# Patient Record
Sex: Female | Born: 1960 | Race: White | Hispanic: No | Marital: Married | Smoking: Former smoker
Health system: Southern US, Community
[De-identification: ages and names within clinical notes are randomized; demographics above are authoritative.]

## PROBLEM LIST (undated history)

## (undated) DIAGNOSIS — F32A Depression, unspecified: Secondary | ICD-10-CM

## (undated) DIAGNOSIS — F4001 Agoraphobia with panic disorder: Secondary | ICD-10-CM

## (undated) DIAGNOSIS — F1320 Sedative, hypnotic or anxiolytic dependence, uncomplicated: Secondary | ICD-10-CM

## (undated) DIAGNOSIS — F329 Major depressive disorder, single episode, unspecified: Secondary | ICD-10-CM

## (undated) DIAGNOSIS — F419 Anxiety disorder, unspecified: Secondary | ICD-10-CM

## (undated) DIAGNOSIS — K219 Gastro-esophageal reflux disease without esophagitis: Secondary | ICD-10-CM

## (undated) DIAGNOSIS — F429 Obsessive-compulsive disorder, unspecified: Secondary | ICD-10-CM

## (undated) DIAGNOSIS — F319 Bipolar disorder, unspecified: Secondary | ICD-10-CM

## (undated) DIAGNOSIS — Z79899 Other long term (current) drug therapy: Secondary | ICD-10-CM

## (undated) HISTORY — DX: Major depressive disorder, single episode, unspecified: F32.9

## (undated) HISTORY — DX: Depression, unspecified: F32.A

## (undated) HISTORY — DX: Bipolar disorder, unspecified: F31.9

---

## 2004-09-20 ENCOUNTER — Emergency Department: Payer: Self-pay | Admitting: Emergency Medicine

## 2006-07-19 ENCOUNTER — Emergency Department: Payer: Self-pay | Admitting: Emergency Medicine

## 2007-11-23 ENCOUNTER — Emergency Department: Payer: Self-pay | Admitting: Emergency Medicine

## 2008-05-15 ENCOUNTER — Emergency Department: Payer: Self-pay | Admitting: Emergency Medicine

## 2011-01-14 ENCOUNTER — Emergency Department: Payer: Self-pay | Admitting: Emergency Medicine

## 2011-02-10 ENCOUNTER — Emergency Department: Payer: Self-pay | Admitting: Emergency Medicine

## 2011-02-12 ENCOUNTER — Emergency Department: Payer: Self-pay | Admitting: Emergency Medicine

## 2011-05-31 ENCOUNTER — Inpatient Hospital Stay: Payer: Self-pay | Admitting: Psychiatry

## 2011-09-25 ENCOUNTER — Emergency Department: Payer: Self-pay | Admitting: Emergency Medicine

## 2011-09-25 LAB — BASIC METABOLIC PANEL
Anion Gap: 4 — ABNORMAL LOW (ref 7–16)
BUN: 26 mg/dL — ABNORMAL HIGH (ref 7–18)
Calcium, Total: 8.9 mg/dL (ref 8.5–10.1)
Chloride: 110 mmol/L — ABNORMAL HIGH (ref 98–107)
Co2: 29 mmol/L (ref 21–32)
Creatinine: 0.81 mg/dL (ref 0.60–1.30)
EGFR (African American): >60
EGFR (Non-African Amer.): >60
Glucose: 84 mg/dL (ref 65–99)
Osmolality: 289 (ref 275–301)
Potassium: 4.2 mmol/L (ref 3.5–5.1)
Sodium: 143 mmol/L (ref 136–145)

## 2011-09-25 LAB — CBC
HGB: 13.8 g/dL (ref 12.0–16.0)
MCH: 32.3 pg (ref 26.0–34.0)
MCHC: 33.3 g/dL (ref 32.0–36.0)
MCV: 97 fL (ref 80–100)
Platelet: 207 10*3/uL (ref 150–440)
RBC: 4.27 10*6/uL (ref 3.80–5.20)

## 2011-12-31 ENCOUNTER — Inpatient Hospital Stay: Payer: Self-pay | Admitting: Psychiatry

## 2012-01-06 LAB — LIPID PANEL
Ldl Cholesterol, Calc: 184 mg/dL — ABNORMAL HIGH (ref 0–100)
Triglycerides: 91 mg/dL (ref 0–200)
VLDL Cholesterol, Calc: 18 mg/dL (ref 5–40)

## 2012-08-26 ENCOUNTER — Emergency Department: Payer: Self-pay | Admitting: Emergency Medicine

## 2013-01-11 ENCOUNTER — Emergency Department: Payer: Self-pay | Admitting: Emergency Medicine

## 2013-01-22 ENCOUNTER — Emergency Department: Payer: Self-pay | Admitting: Emergency Medicine

## 2013-01-22 LAB — COMPREHENSIVE METABOLIC PANEL
Alkaline Phosphatase: 112 U/L (ref 50–136)
BUN: 16 mg/dL (ref 7–18)
Bilirubin,Total: 0.4 mg/dL (ref 0.2–1.0)
Chloride: 103 mmol/L (ref 98–107)
Co2: 27 mmol/L (ref 21–32)
EGFR (African American): >60
EGFR (Non-African Amer.): 60 — ABNORMAL LOW
Glucose: 102 mg/dL — ABNORMAL HIGH (ref 65–99)
Osmolality: 273 (ref 275–301)
Potassium: 4.2 mmol/L (ref 3.5–5.1)
SGOT(AST): 16 U/L (ref 15–37)
Total Protein: 8.2 g/dL (ref 6.4–8.2)

## 2013-01-22 LAB — DRUG SCREEN, URINE
Barbiturates, Ur Screen: NEGATIVE (ref ?–200)
Benzodiazepine, Ur Scrn: NEGATIVE (ref ?–200)
Cocaine Metabolite,Ur ~~LOC~~: NEGATIVE (ref ?–300)
MDMA (Ecstasy)Ur Screen: NEGATIVE (ref ?–500)
Methadone, Ur Screen: NEGATIVE (ref ?–300)
Opiate, Ur Screen: NEGATIVE (ref ?–300)
Phencyclidine (PCP) Ur S: NEGATIVE (ref ?–25)

## 2013-01-22 LAB — TSH: Thyroid Stimulating Horm: 0.69 u[IU]/mL

## 2013-01-22 LAB — CBC
HGB: 15.5 g/dL (ref 12.0–16.0)
MCH: 31.6 pg (ref 26.0–34.0)
Platelet: 273 10*3/uL (ref 150–440)
RBC: 4.9 10*6/uL (ref 3.80–5.20)
WBC: 6.6 10*3/uL (ref 3.6–11.0)

## 2013-01-22 LAB — SALICYLATE LEVEL: Salicylates, Serum: 5.1 mg/dL — ABNORMAL HIGH

## 2013-01-22 LAB — ETHANOL: Ethanol: <3 mg/dL

## 2013-01-23 ENCOUNTER — Inpatient Hospital Stay: Payer: Self-pay | Admitting: Psychiatry

## 2013-01-23 LAB — URINALYSIS, COMPLETE
Bacteria: NONE SEEN
Bilirubin,UR: NEGATIVE
Glucose,UR: NEGATIVE mg/dL (ref 0–75)
Leukocyte Esterase: NEGATIVE
Ph: 6 (ref 4.5–8.0)
WBC UR: 2 /HPF (ref 0–5)

## 2013-01-24 LAB — BEHAVIORAL MEDICINE 1 PANEL
Albumin: 4.3 g/dL (ref 3.4–5.0)
BUN: 18 mg/dL (ref 7–18)
Basophil %: 1.3 %
Co2: 24 mmol/L (ref 21–32)
Creatinine: 1.25 mg/dL (ref 0.60–1.30)
Eosinophil %: 0 %
HGB: 16.5 g/dL — ABNORMAL HIGH (ref 12.0–16.0)
MCHC: 34.7 g/dL (ref 32.0–36.0)
MCV: 93 fL (ref 80–100)
Monocyte #: 0.6 x10 3/mm (ref 0.2–0.9)
Monocyte %: 10 %
Neutrophil #: 3.4 10*3/uL (ref 1.4–6.5)
Platelet: 270 10*3/uL (ref 150–440)
Potassium: 3.9 mmol/L (ref 3.5–5.1)
RBC: 5.13 10*6/uL (ref 3.80–5.20)
RDW: 14.7 % — ABNORMAL HIGH (ref 11.5–14.5)
SGPT (ALT): 14 U/L (ref 12–78)
Sodium: 134 mmol/L — ABNORMAL LOW (ref 136–145)
Total Protein: 8.1 g/dL (ref 6.4–8.2)
WBC: 5.7 10*3/uL (ref 3.6–11.0)

## 2013-01-24 LAB — LIPID PANEL
HDL Cholesterol: 42 mg/dL (ref 40–60)
Ldl Cholesterol, Calc: 267 mg/dL — ABNORMAL HIGH (ref 0–100)
Triglycerides: 140 mg/dL (ref 0–200)
VLDL Cholesterol, Calc: 28 mg/dL (ref 5–40)

## 2013-02-18 ENCOUNTER — Ambulatory Visit: Payer: Self-pay | Admitting: Psychiatry

## 2013-02-19 ENCOUNTER — Emergency Department: Payer: Self-pay | Admitting: Emergency Medicine

## 2013-02-19 LAB — URINALYSIS, COMPLETE
Ketone: NEGATIVE
Ph: 6 (ref 4.5–8.0)
RBC,UR: 309 /HPF (ref 0–5)
Specific Gravity: 1.019 (ref 1.003–1.030)
Squamous Epithelial: 3
WBC UR: 4 /HPF (ref 0–5)

## 2013-02-19 LAB — COMPREHENSIVE METABOLIC PANEL
Albumin: 3.8 g/dL (ref 3.4–5.0)
Anion Gap: 10 (ref 7–16)
Bilirubin,Total: 0.4 mg/dL (ref 0.2–1.0)
Calcium, Total: 9.3 mg/dL (ref 8.5–10.1)
EGFR (African American): >60
EGFR (Non-African Amer.): 57 — ABNORMAL LOW
Glucose: 152 mg/dL — ABNORMAL HIGH (ref 65–99)
Osmolality: 288 (ref 275–301)
Potassium: 3.8 mmol/L (ref 3.5–5.1)
SGOT(AST): 26 U/L (ref 15–37)

## 2013-02-19 LAB — CBC
HCT: 39 % (ref 35.0–47.0)
HGB: 13.4 g/dL (ref 12.0–16.0)
MCH: 31.1 pg (ref 26.0–34.0)
MCHC: 34.5 g/dL (ref 32.0–36.0)
Platelet: 201 10*3/uL (ref 150–440)
RBC: 4.31 10*6/uL (ref 3.80–5.20)
RDW: 13.5 % (ref 11.5–14.5)

## 2013-03-15 ENCOUNTER — Ambulatory Visit: Payer: Self-pay | Admitting: Psychiatry

## 2013-03-31 ENCOUNTER — Emergency Department: Payer: Self-pay | Admitting: Unknown Physician Specialty

## 2013-03-31 LAB — CBC
HGB: 11.9 g/dL — ABNORMAL LOW (ref 12.0–16.0)
MCH: 32 pg (ref 26.0–34.0)
Platelet: 245 10*3/uL (ref 150–440)
RDW: 17.1 % — ABNORMAL HIGH (ref 11.5–14.5)
WBC: 4.3 10*3/uL (ref 3.6–11.0)

## 2013-03-31 LAB — COMPREHENSIVE METABOLIC PANEL
BUN: 20 mg/dL — ABNORMAL HIGH (ref 7–18)
Bilirubin,Total: 0.4 mg/dL (ref 0.2–1.0)
Chloride: 115 mmol/L — ABNORMAL HIGH (ref 98–107)
Co2: 22 mmol/L (ref 21–32)
Creatinine: 0.78 mg/dL (ref 0.60–1.30)
EGFR (African American): >60
EGFR (Non-African Amer.): >60
Osmolality: 285 (ref 275–301)
Potassium: 3.4 mmol/L — ABNORMAL LOW (ref 3.5–5.1)
SGPT (ALT): 13 U/L (ref 12–78)

## 2013-03-31 LAB — CK TOTAL AND CKMB (NOT AT ARMC): CK-MB: 5.5 ng/mL — ABNORMAL HIGH (ref 0.5–3.6)

## 2013-03-31 LAB — TROPONIN I: Troponin-I: <0.02 ng/mL

## 2013-04-13 ENCOUNTER — Ambulatory Visit: Payer: Self-pay | Admitting: Psychiatry

## 2013-04-30 LAB — CBC
HCT: 31.9 % — ABNORMAL LOW (ref 35.0–47.0)
MCH: 33.3 pg (ref 26.0–34.0)
MCHC: 33.8 g/dL (ref 32.0–36.0)
RBC: 3.23 10*6/uL — ABNORMAL LOW (ref 3.80–5.20)
WBC: 10.1 10*3/uL (ref 3.6–11.0)

## 2013-04-30 LAB — URINALYSIS, COMPLETE
Blood: NEGATIVE
Glucose,UR: NEGATIVE mg/dL (ref 0–75)
Hyaline Cast: 3
Ph: 5 (ref 4.5–8.0)
RBC,UR: <1 /HPF (ref 0–5)
Specific Gravity: 1.024 (ref 1.003–1.030)
Squamous Epithelial: NONE SEEN

## 2013-04-30 LAB — COMPREHENSIVE METABOLIC PANEL
Albumin: 2.8 g/dL — ABNORMAL LOW (ref 3.4–5.0)
Alkaline Phosphatase: 68 U/L (ref 50–136)
Anion Gap: 6 — ABNORMAL LOW (ref 7–16)
BUN: 19 mg/dL — ABNORMAL HIGH (ref 7–18)
Bilirubin,Total: 0.6 mg/dL (ref 0.2–1.0)
Calcium, Total: 6.3 mg/dL — CL (ref 8.5–10.1)
Chloride: 126 mmol/L — ABNORMAL HIGH (ref 98–107)
Co2: 12 mmol/L — ABNORMAL LOW (ref 21–32)
Creatinine: 0.64 mg/dL (ref 0.60–1.30)
EGFR (African American): >60
EGFR (Non-African Amer.): >60
Glucose: 117 mg/dL — ABNORMAL HIGH (ref 65–99)
Osmolality: 290 (ref 275–301)
Potassium: 3.3 mmol/L — ABNORMAL LOW (ref 3.5–5.1)
Sodium: 144 mmol/L (ref 136–145)
Total Protein: 5.5 g/dL — ABNORMAL LOW (ref 6.4–8.2)

## 2013-04-30 LAB — SALICYLATE LEVEL: Salicylates, Serum: 63.7 mg/dL

## 2013-04-30 LAB — TSH: Thyroid Stimulating Horm: 0.419 u[IU]/mL — ABNORMAL LOW

## 2013-04-30 LAB — ETHANOL: Ethanol %: <0.003 % (ref 0.000–0.080)

## 2013-05-01 ENCOUNTER — Inpatient Hospital Stay: Payer: Self-pay | Admitting: Internal Medicine

## 2013-05-01 LAB — BASIC METABOLIC PANEL WITH GFR
Anion Gap: 1 — ABNORMAL LOW
Anion Gap: 1 — ABNORMAL LOW
BUN: 25 mg/dL — ABNORMAL HIGH
BUN: 22 mg/dL — ABNORMAL HIGH
Calcium, Total: 7.4 mg/dL — ABNORMAL LOW
Calcium, Total: 7.3 mg/dL — ABNORMAL LOW
Chloride: 109 mmol/L — ABNORMAL HIGH
Chloride: 107 mmol/L
Co2: 30 mmol/L
Co2: 33 mmol/L — ABNORMAL HIGH
Creatinine: 0.77 mg/dL
Creatinine: 0.88 mg/dL
EGFR (African American): >60
EGFR (African American): >60
EGFR (Non-African Amer.): >60
EGFR (Non-African Amer.): >60
Glucose: 173 mg/dL — ABNORMAL HIGH
Glucose: 142 mg/dL — ABNORMAL HIGH
Osmolality: 288
Osmolality: 287
Potassium: 3.1 mmol/L — ABNORMAL LOW
Potassium: 3.5 mmol/L
Sodium: 140 mmol/L
Sodium: 141 mmol/L

## 2013-05-01 LAB — DRUG SCREEN, URINE
Barbiturates, Ur Screen: NEGATIVE (ref ?–200)
Opiate, Ur Screen: NEGATIVE (ref ?–300)
Phencyclidine (PCP) Ur S: NEGATIVE (ref ?–25)
Tricyclic, Ur Screen: NEGATIVE (ref ?–1000)

## 2013-05-01 LAB — CBC WITH DIFFERENTIAL/PLATELET
Basophil #: 0 10*3/uL (ref 0.0–0.1)
Eosinophil %: 0.1 %
HCT: 31.7 % — ABNORMAL LOW (ref 35.0–47.0)
HGB: 11 g/dL — ABNORMAL LOW (ref 12.0–16.0)
MCHC: 34.9 g/dL (ref 32.0–36.0)
MCV: 96 fL (ref 80–100)
Monocyte %: 3.2 %
Neutrophil #: 8 10*3/uL — ABNORMAL HIGH (ref 1.4–6.5)
RBC: 3.31 10*6/uL — ABNORMAL LOW (ref 3.80–5.20)
RDW: 16.7 % — ABNORMAL HIGH (ref 11.5–14.5)

## 2013-05-01 LAB — BASIC METABOLIC PANEL
Anion Gap: 1 — ABNORMAL LOW (ref 7–16)
Anion Gap: 2 — ABNORMAL LOW (ref 7–16)
BUN: 27 mg/dL — ABNORMAL HIGH (ref 7–18)
BUN: 27 mg/dL — ABNORMAL HIGH (ref 7–18)
Calcium, Total: 8.1 mg/dL — ABNORMAL LOW (ref 8.5–10.1)
Calcium, Total: 7.6 mg/dL — ABNORMAL LOW (ref 8.5–10.1)
Co2: 27 mmol/L (ref 21–32)
Creatinine: 0.84 mg/dL (ref 0.60–1.30)
Creatinine: 0.84 mg/dL (ref 0.60–1.30)
EGFR (African American): >60
EGFR (African American): >60
EGFR (Non-African Amer.): >60
Glucose: 192 mg/dL — ABNORMAL HIGH (ref 65–99)
Osmolality: 294 (ref 275–301)
Osmolality: 292 (ref 275–301)
Potassium: 3.8 mmol/L (ref 3.5–5.1)
Potassium: 3.4 mmol/L — ABNORMAL LOW (ref 3.5–5.1)
Sodium: 141 mmol/L (ref 136–145)

## 2013-05-01 LAB — SALICYLATE LEVEL
Salicylates, Serum: 66.5 mg/dL
Salicylates, Serum: 47.8 mg/dL
Salicylates, Serum: 41.2 mg/dL
Salicylates, Serum: 31.1 mg/dL

## 2013-05-01 LAB — URINE PH
Ph: 5 (ref 4.5–8.0)
Ph: 5

## 2013-05-02 LAB — CBC WITH DIFFERENTIAL/PLATELET
Basophil #: 0 10*3/uL (ref 0.0–0.1)
Eosinophil #: 0 10*3/uL (ref 0.0–0.7)
Eosinophil %: 0.1 %
HGB: 9.9 g/dL — ABNORMAL LOW (ref 12.0–16.0)
Lymphocyte #: 1.3 10*3/uL (ref 1.0–3.6)
Lymphocyte %: 13.8 %
MCH: 33.3 pg (ref 26.0–34.0)
MCHC: 34.6 g/dL (ref 32.0–36.0)
Monocyte #: 0.9 x10 3/mm (ref 0.2–0.9)
Neutrophil %: 76.4 %
RBC: 2.98 10*6/uL — ABNORMAL LOW (ref 3.80–5.20)
WBC: 9.6 10*3/uL (ref 3.6–11.0)

## 2013-05-02 LAB — BASIC METABOLIC PANEL
Anion Gap: 2 — ABNORMAL LOW (ref 7–16)
Anion Gap: 4 — ABNORMAL LOW (ref 7–16)
BUN: 18 mg/dL (ref 7–18)
Calcium, Total: 7.7 mg/dL — ABNORMAL LOW (ref 8.5–10.1)
Chloride: 102 mmol/L (ref 98–107)
Co2: 37 mmol/L — ABNORMAL HIGH (ref 21–32)
Creatinine: 0.72 mg/dL (ref 0.60–1.30)
EGFR (African American): >60
EGFR (Non-African Amer.): >60
Glucose: 132 mg/dL — ABNORMAL HIGH (ref 65–99)
Osmolality: 287 (ref 275–301)
Potassium: 3.1 mmol/L — ABNORMAL LOW (ref 3.5–5.1)
Sodium: 142 mmol/L (ref 136–145)

## 2013-05-02 LAB — SALICYLATE LEVEL: Salicylates, Serum: 11.6 mg/dL — ABNORMAL HIGH

## 2013-05-02 LAB — MAGNESIUM: Magnesium: 2.4 mg/dL

## 2013-05-03 LAB — BASIC METABOLIC PANEL
BUN: 10 mg/dL (ref 7–18)
Chloride: 107 mmol/L (ref 98–107)
EGFR (African American): >60
Sodium: 141 mmol/L (ref 136–145)

## 2013-05-03 LAB — CBC WITH DIFFERENTIAL/PLATELET
Basophil %: 0.4 %
Eosinophil %: 0.2 %
Lymphocyte #: 0.7 10*3/uL — ABNORMAL LOW (ref 1.0–3.6)
Lymphocyte %: 11.6 %
MCH: 33 pg (ref 26.0–34.0)
MCHC: 34 g/dL (ref 32.0–36.0)
MCV: 97 fL (ref 80–100)
Monocyte #: 0.4 x10 3/mm (ref 0.2–0.9)
Monocyte %: 5.8 %
Platelet: 114 10*3/uL — ABNORMAL LOW (ref 150–440)
RBC: 3.01 10*6/uL — ABNORMAL LOW (ref 3.80–5.20)
WBC: 6.2 10*3/uL (ref 3.6–11.0)

## 2013-05-03 LAB — MAGNESIUM: Magnesium: 1.9 mg/dL

## 2013-05-04 LAB — BASIC METABOLIC PANEL
Anion Gap: 4 — ABNORMAL LOW (ref 7–16)
BUN: 16 mg/dL (ref 7–18)
Calcium, Total: 9 mg/dL (ref 8.5–10.1)
Chloride: 113 mmol/L — ABNORMAL HIGH (ref 98–107)
Creatinine: 0.54 mg/dL — ABNORMAL LOW (ref 0.60–1.30)
EGFR (African American): >60
Glucose: 129 mg/dL — ABNORMAL HIGH (ref 65–99)
Osmolality: 286 (ref 275–301)
Potassium: 4.2 mmol/L (ref 3.5–5.1)
Sodium: 142 mmol/L (ref 136–145)

## 2013-05-04 LAB — CBC WITH DIFFERENTIAL/PLATELET
Basophil #: 0 10*3/uL (ref 0.0–0.1)
Basophil %: 0.3 %
Eosinophil #: 0 10*3/uL (ref 0.0–0.7)
HCT: 29.9 % — ABNORMAL LOW (ref 35.0–47.0)
HGB: 10.3 g/dL — ABNORMAL LOW (ref 12.0–16.0)
Lymphocyte %: 5.6 %
MCH: 33.2 pg (ref 26.0–34.0)
MCHC: 34.5 g/dL (ref 32.0–36.0)
Monocyte #: 0.3 x10 3/mm (ref 0.2–0.9)
Neutrophil %: 88 %
RDW: 15.9 % — ABNORMAL HIGH (ref 11.5–14.5)

## 2013-05-04 LAB — PHOSPHORUS: Phosphorus: 2.8 mg/dL (ref 2.5–4.9)

## 2013-05-04 LAB — EXPECTORATED SPUTUM ASSESSMENT W GRAM STAIN, RFLX TO RESP C

## 2013-05-04 LAB — MAGNESIUM: Magnesium: 2.1 mg/dL

## 2013-05-05 ENCOUNTER — Ambulatory Visit: Payer: Self-pay | Admitting: Neurology

## 2013-05-05 LAB — BASIC METABOLIC PANEL
Anion Gap: 6 — ABNORMAL LOW (ref 7–16)
Chloride: 110 mmol/L — ABNORMAL HIGH (ref 98–107)
Co2: 24 mmol/L (ref 21–32)
Creatinine: 0.4 mg/dL — ABNORMAL LOW (ref 0.60–1.30)
EGFR (African American): >60
EGFR (Non-African Amer.): >60
Glucose: 110 mg/dL — ABNORMAL HIGH (ref 65–99)
Osmolality: 281 (ref 275–301)
Potassium: 4.2 mmol/L (ref 3.5–5.1)
Sodium: 140 mmol/L (ref 136–145)

## 2013-05-05 LAB — CBC WITH DIFFERENTIAL/PLATELET
Basophil #: 0 10*3/uL (ref 0.0–0.1)
Eosinophil #: 0 10*3/uL (ref 0.0–0.7)
HGB: 10.9 g/dL — ABNORMAL LOW (ref 12.0–16.0)
Lymphocyte #: 0.6 10*3/uL — ABNORMAL LOW (ref 1.0–3.6)
Lymphocyte %: 8.4 %
MCV: 96 fL (ref 80–100)
Monocyte %: 7.6 %
Neutrophil #: 6 10*3/uL (ref 1.4–6.5)
Neutrophil %: 83.5 %
RBC: 3.32 10*6/uL — ABNORMAL LOW (ref 3.80–5.20)
RDW: 15.9 % — ABNORMAL HIGH (ref 11.5–14.5)
WBC: 7.2 10*3/uL (ref 3.6–11.0)

## 2013-05-06 LAB — CBC WITH DIFFERENTIAL/PLATELET
Basophil #: 0 10*3/uL (ref 0.0–0.1)
Basophil %: 0.5 %
Eosinophil %: 0 %
HGB: 12.9 g/dL (ref 12.0–16.0)
Neutrophil %: 81.5 %
RBC: 3.92 10*6/uL (ref 3.80–5.20)
RDW: 15.8 % — ABNORMAL HIGH (ref 11.5–14.5)
WBC: 7.1 10*3/uL (ref 3.6–11.0)

## 2013-05-06 LAB — MAGNESIUM: Magnesium: 1.9 mg/dL

## 2013-05-06 LAB — BASIC METABOLIC PANEL
Anion Gap: 6 — ABNORMAL LOW (ref 7–16)
BUN: 24 mg/dL — ABNORMAL HIGH (ref 7–18)
Co2: 28 mmol/L (ref 21–32)
EGFR (African American): >60
EGFR (Non-African Amer.): >60
Osmolality: 273 (ref 275–301)
Sodium: 134 mmol/L — ABNORMAL LOW (ref 136–145)

## 2013-05-07 LAB — BASIC METABOLIC PANEL
BUN: 32 mg/dL — ABNORMAL HIGH (ref 7–18)
Calcium, Total: 10.2 mg/dL — ABNORMAL HIGH (ref 8.5–10.1)
Creatinine: 0.57 mg/dL — ABNORMAL LOW (ref 0.60–1.30)
EGFR (African American): >60
EGFR (Non-African Amer.): >60
Sodium: 130 mmol/L — ABNORMAL LOW (ref 136–145)

## 2013-05-07 LAB — PHOSPHORUS: Phosphorus: 4 mg/dL (ref 2.5–4.9)

## 2013-05-08 LAB — BASIC METABOLIC PANEL
Anion Gap: 7 (ref 7–16)
BUN: 24 mg/dL — ABNORMAL HIGH (ref 7–18)
Calcium, Total: 10.1 mg/dL (ref 8.5–10.1)
Chloride: 90 mmol/L — ABNORMAL LOW (ref 98–107)
Co2: 29 mmol/L (ref 21–32)
Creatinine: 0.65 mg/dL (ref 0.60–1.30)
EGFR (African American): >60
EGFR (Non-African Amer.): >60
Glucose: 103 mg/dL — ABNORMAL HIGH (ref 65–99)
Potassium: 4 mmol/L (ref 3.5–5.1)
Sodium: 126 mmol/L — ABNORMAL LOW (ref 136–145)

## 2013-05-08 LAB — PHOSPHORUS: Phosphorus: 3.1 mg/dL (ref 2.5–4.9)

## 2013-05-08 LAB — MAGNESIUM: Magnesium: 2.2 mg/dL

## 2013-05-09 ENCOUNTER — Inpatient Hospital Stay: Payer: Self-pay | Admitting: Psychiatry

## 2013-05-09 LAB — PHOSPHORUS: Phosphorus: 3.3 mg/dL

## 2013-05-09 LAB — SODIUM, URINE, RANDOM: Sodium, Urine Random: 36 mmol/L

## 2013-05-09 LAB — CALCIUM: Calcium, Total: 9.6 mg/dL (ref 8.5–10.1)

## 2013-05-09 LAB — MAGNESIUM: Magnesium: 2.1 mg/dL

## 2013-05-09 LAB — POTASSIUM: Potassium: 3.3 mmol/L — ABNORMAL LOW (ref 3.5–5.1)

## 2013-05-09 LAB — SODIUM: Sodium: 133 mmol/L — ABNORMAL LOW (ref 136–145)

## 2013-05-10 LAB — BASIC METABOLIC PANEL
Anion Gap: 8 (ref 7–16)
Calcium, Total: 9.3 mg/dL (ref 8.5–10.1)
Chloride: 101 mmol/L (ref 98–107)
Co2: 26 mmol/L (ref 21–32)
Creatinine: 0.83 mg/dL (ref 0.60–1.30)
Osmolality: 276 (ref 275–301)
Sodium: 135 mmol/L — ABNORMAL LOW (ref 136–145)

## 2013-05-13 ENCOUNTER — Emergency Department: Payer: Self-pay | Admitting: Emergency Medicine

## 2013-05-13 LAB — COMPREHENSIVE METABOLIC PANEL
Albumin: 3.8 g/dL (ref 3.4–5.0)
Chloride: 101 mmol/L (ref 98–107)
Co2: 25 mmol/L (ref 21–32)
Creatinine: 0.77 mg/dL (ref 0.60–1.30)
EGFR (African American): >60
EGFR (Non-African Amer.): >60
Osmolality: 266 (ref 275–301)
Potassium: 3.8 mmol/L (ref 3.5–5.1)
SGOT(AST): 25 U/L (ref 15–37)
Sodium: 133 mmol/L — ABNORMAL LOW (ref 136–145)
Total Protein: 7.5 g/dL (ref 6.4–8.2)

## 2013-05-13 LAB — TROPONIN I: Troponin-I: <0.02 ng/mL

## 2013-05-13 LAB — CBC
HCT: 43.8 % (ref 35.0–47.0)
MCV: 94 fL (ref 80–100)
Platelet: 519 10*3/uL — ABNORMAL HIGH (ref 150–440)
RBC: 4.67 10*6/uL (ref 3.80–5.20)
RDW: 15.3 % — ABNORMAL HIGH (ref 11.5–14.5)

## 2013-05-13 LAB — URINALYSIS, COMPLETE
Bacteria: NONE SEEN
Bilirubin,UR: NEGATIVE
Blood: NEGATIVE
Glucose,UR: NEGATIVE mg/dL (ref 0–75)
Nitrite: NEGATIVE
Ph: 7 (ref 4.5–8.0)
Protein: NEGATIVE
RBC,UR: 1 /HPF (ref 0–5)
Specific Gravity: 1.006 (ref 1.003–1.030)
Squamous Epithelial: 3
WBC UR: 2 /HPF (ref 0–5)

## 2013-05-14 ENCOUNTER — Ambulatory Visit: Payer: Self-pay | Admitting: Psychiatry

## 2013-05-14 ENCOUNTER — Emergency Department: Payer: Self-pay | Admitting: Emergency Medicine

## 2013-05-14 LAB — DRUG SCREEN, URINE
Amphetamines, Ur Screen: NEGATIVE (ref ?–1000)
Benzodiazepine, Ur Scrn: NEGATIVE (ref ?–200)
Cannabinoid 50 Ng, Ur ~~LOC~~: NEGATIVE (ref ?–50)
Cocaine Metabolite,Ur ~~LOC~~: NEGATIVE (ref ?–300)
Opiate, Ur Screen: NEGATIVE (ref ?–300)
Phencyclidine (PCP) Ur S: NEGATIVE (ref ?–25)
Tricyclic, Ur Screen: NEGATIVE (ref ?–1000)

## 2013-05-14 LAB — URINALYSIS, COMPLETE
Bacteria: NONE SEEN
Bilirubin,UR: NEGATIVE
Glucose,UR: NEGATIVE mg/dL (ref 0–75)
Leukocyte Esterase: NEGATIVE
Ph: 7 (ref 4.5–8.0)
Specific Gravity: 1.002 (ref 1.003–1.030)
Squamous Epithelial: 1

## 2013-05-14 LAB — CBC
HCT: 41.1 % (ref 35.0–47.0)
Platelet: 535 10*3/uL — ABNORMAL HIGH (ref 150–440)
RBC: 4.39 10*6/uL (ref 3.80–5.20)
RDW: 15.2 % — ABNORMAL HIGH (ref 11.5–14.5)

## 2013-05-14 LAB — ETHANOL
Ethanol %: <0.003 % (ref 0.000–0.080)
Ethanol: <3 mg/dL

## 2013-05-14 LAB — COMPREHENSIVE METABOLIC PANEL
Alkaline Phosphatase: 88 U/L (ref 50–136)
Anion Gap: 10 (ref 7–16)
Bilirubin,Total: 0.5 mg/dL (ref 0.2–1.0)
Chloride: 100 mmol/L (ref 98–107)
Creatinine: 0.73 mg/dL (ref 0.60–1.30)
EGFR (Non-African Amer.): >60
Glucose: 103 mg/dL — ABNORMAL HIGH (ref 65–99)
SGPT (ALT): 23 U/L (ref 12–78)
Sodium: 134 mmol/L — ABNORMAL LOW (ref 136–145)

## 2013-05-15 LAB — SALICYLATE LEVEL: Salicylates, Serum: 2.8 mg/dL

## 2014-03-04 ENCOUNTER — Emergency Department: Payer: Self-pay | Admitting: Emergency Medicine

## 2014-03-04 LAB — CBC WITH DIFFERENTIAL/PLATELET
Basophil #: 0 10*3/uL (ref 0.0–0.1)
Basophil %: 0.6 %
EOS ABS: 0.1 10*3/uL (ref 0.0–0.7)
Eosinophil %: 1.1 %
HCT: 43.8 % (ref 35.0–47.0)
HGB: 14.8 g/dL (ref 12.0–16.0)
LYMPHS ABS: 1.5 10*3/uL (ref 1.0–3.6)
LYMPHS PCT: 26.8 %
MCH: 31.4 pg (ref 26.0–34.0)
MCHC: 33.9 g/dL (ref 32.0–36.0)
MCV: 93 fL (ref 80–100)
Monocyte #: 0.3 x10 3/mm (ref 0.2–0.9)
Monocyte %: 5.6 %
NEUTROS PCT: 65.9 %
Neutrophil #: 3.6 10*3/uL (ref 1.4–6.5)
Platelet: 198 10*3/uL (ref 150–440)
RBC: 4.73 10*6/uL (ref 3.80–5.20)
RDW: 12.2 % (ref 11.5–14.5)
WBC: 5.5 10*3/uL (ref 3.6–11.0)

## 2014-03-04 LAB — COMPREHENSIVE METABOLIC PANEL
ALK PHOS: 107 U/L
Albumin: 4.3 g/dL (ref 3.4–5.0)
Anion Gap: 6 — ABNORMAL LOW (ref 7–16)
BILIRUBIN TOTAL: 0.7 mg/dL (ref 0.2–1.0)
BUN: 13 mg/dL (ref 7–18)
CALCIUM: 9.5 mg/dL (ref 8.5–10.1)
Chloride: 102 mmol/L (ref 98–107)
Co2: 28 mmol/L (ref 21–32)
Creatinine: 1.13 mg/dL (ref 0.60–1.30)
GFR CALC NON AF AMER: 56 — AB
Glucose: 89 mg/dL (ref 65–99)
Osmolality: 272 (ref 275–301)
Potassium: 4.3 mmol/L (ref 3.5–5.1)
SGOT(AST): 25 U/L (ref 15–37)
SGPT (ALT): 16 U/L (ref 12–78)
Sodium: 136 mmol/L (ref 136–145)
Total Protein: 8 g/dL (ref 6.4–8.2)

## 2014-03-04 LAB — LIPASE, BLOOD: LIPASE: 151 U/L (ref 73–393)

## 2014-03-04 LAB — TROPONIN I: Troponin-I: <0.02 ng/mL

## 2014-04-21 ENCOUNTER — Emergency Department: Payer: Self-pay | Admitting: Emergency Medicine

## 2014-04-21 LAB — URINALYSIS, COMPLETE
BILIRUBIN, UR: NEGATIVE
Bacteria: NONE SEEN
Blood: NEGATIVE
GLUCOSE, UR: NEGATIVE mg/dL (ref 0–75)
Nitrite: NEGATIVE
PH: 6 (ref 4.5–8.0)
Protein: NEGATIVE
Specific Gravity: 1.017 (ref 1.003–1.030)
Squamous Epithelial: 1
WBC UR: 9 /HPF (ref 0–5)

## 2014-04-21 LAB — CBC WITH DIFFERENTIAL/PLATELET
Basophil #: 0 10*3/uL (ref 0.0–0.1)
Basophil %: 0.6 %
Eosinophil #: 0 10*3/uL (ref 0.0–0.7)
Eosinophil %: 0.7 %
HCT: 44.6 % (ref 35.0–47.0)
HGB: 14.9 g/dL (ref 12.0–16.0)
Lymphocyte #: 1.2 10*3/uL (ref 1.0–3.6)
Lymphocyte %: 19.6 %
MCH: 31.4 pg (ref 26.0–34.0)
MCHC: 33.4 g/dL (ref 32.0–36.0)
MCV: 94 fL (ref 80–100)
MONO ABS: 0.3 x10 3/mm (ref 0.2–0.9)
MONOS PCT: 5.5 %
Neutrophil #: 4.3 10*3/uL (ref 1.4–6.5)
Neutrophil %: 73.6 %
Platelet: 207 10*3/uL (ref 150–440)
RBC: 4.75 10*6/uL (ref 3.80–5.20)
RDW: 12.6 % (ref 11.5–14.5)
WBC: 5.9 10*3/uL (ref 3.6–11.0)

## 2014-04-21 LAB — COMPREHENSIVE METABOLIC PANEL
ALBUMIN: 4.1 g/dL (ref 3.4–5.0)
ALT: 20 U/L
ANION GAP: 6 — AB (ref 7–16)
Alkaline Phosphatase: 96 U/L
BILIRUBIN TOTAL: 0.5 mg/dL (ref 0.2–1.0)
BUN: 15 mg/dL (ref 7–18)
CHLORIDE: 103 mmol/L (ref 98–107)
CO2: 30 mmol/L (ref 21–32)
Calcium, Total: 9.6 mg/dL (ref 8.5–10.1)
Creatinine: 1.26 mg/dL (ref 0.60–1.30)
EGFR (African American): 57 — ABNORMAL LOW
EGFR (Non-African Amer.): 49 — ABNORMAL LOW
Glucose: 107 mg/dL — ABNORMAL HIGH (ref 65–99)
Osmolality: 279 (ref 275–301)
Potassium: 4 mmol/L (ref 3.5–5.1)
SGOT(AST): 16 U/L (ref 15–37)
SODIUM: 139 mmol/L (ref 136–145)
Total Protein: 7.7 g/dL (ref 6.4–8.2)

## 2014-04-21 LAB — LIPASE, BLOOD: Lipase: 162 U/L (ref 73–393)

## 2014-04-25 ENCOUNTER — Ambulatory Visit: Payer: Self-pay | Admitting: Internal Medicine

## 2014-05-01 ENCOUNTER — Ambulatory Visit: Payer: Self-pay | Admitting: Internal Medicine

## 2014-05-06 ENCOUNTER — Emergency Department: Payer: Self-pay | Admitting: Emergency Medicine

## 2014-05-06 LAB — CBC WITH DIFFERENTIAL/PLATELET
BASOS PCT: 0.7 %
Basophil #: 0 10*3/uL (ref 0.0–0.1)
EOS PCT: 0.4 %
Eosinophil #: 0 10*3/uL (ref 0.0–0.7)
HCT: 44.3 % (ref 35.0–47.0)
HGB: 14.5 g/dL (ref 12.0–16.0)
Lymphocyte #: 1.7 10*3/uL (ref 1.0–3.6)
Lymphocyte %: 28.5 %
MCH: 30.9 pg (ref 26.0–34.0)
MCHC: 32.8 g/dL (ref 32.0–36.0)
MCV: 94 fL (ref 80–100)
MONO ABS: 0.3 x10 3/mm (ref 0.2–0.9)
MONOS PCT: 5.3 %
Neutrophil #: 3.8 10*3/uL (ref 1.4–6.5)
Neutrophil %: 65.1 %
Platelet: 224 10*3/uL (ref 150–440)
RBC: 4.7 10*6/uL (ref 3.80–5.20)
RDW: 12.6 % (ref 11.5–14.5)
WBC: 5.9 10*3/uL (ref 3.6–11.0)

## 2014-05-06 LAB — COMPREHENSIVE METABOLIC PANEL
ALT: 20 U/L
Albumin: 3.9 g/dL (ref 3.4–5.0)
Alkaline Phosphatase: 88 U/L
Anion Gap: 8 (ref 7–16)
BILIRUBIN TOTAL: 0.4 mg/dL (ref 0.2–1.0)
BUN: 14 mg/dL (ref 7–18)
CREATININE: 1.26 mg/dL (ref 0.60–1.30)
Calcium, Total: 8.7 mg/dL (ref 8.5–10.1)
Chloride: 107 mmol/L (ref 98–107)
Co2: 23 mmol/L (ref 21–32)
EGFR (Non-African Amer.): 49 — ABNORMAL LOW
GFR CALC AF AMER: 57 — AB
Glucose: 93 mg/dL (ref 65–99)
Osmolality: 276 (ref 275–301)
Potassium: 3.9 mmol/L (ref 3.5–5.1)
SGOT(AST): 19 U/L (ref 15–37)
Sodium: 138 mmol/L (ref 136–145)
TOTAL PROTEIN: 7.3 g/dL (ref 6.4–8.2)

## 2014-05-06 LAB — LIPASE, BLOOD: LIPASE: 147 U/L (ref 73–393)

## 2014-05-10 ENCOUNTER — Ambulatory Visit: Payer: Self-pay | Admitting: Internal Medicine

## 2014-05-21 ENCOUNTER — Ambulatory Visit: Payer: Self-pay | Admitting: Gastroenterology

## 2014-05-23 ENCOUNTER — Inpatient Hospital Stay: Payer: Self-pay | Admitting: Psychiatry

## 2014-05-23 LAB — BEHAVIORAL MEDICINE 1 PANEL
AST: 19 U/L (ref 15–37)
Albumin: 3.7 g/dL (ref 3.4–5.0)
Alkaline Phosphatase: 87 U/L
Anion Gap: 6 — ABNORMAL LOW (ref 7–16)
BASOS ABS: 0 10*3/uL (ref 0.0–0.1)
BUN: 13 mg/dL (ref 7–18)
Basophil %: 0.6 %
Bilirubin,Total: 0.4 mg/dL (ref 0.2–1.0)
CHLORIDE: 104 mmol/L (ref 98–107)
CREATININE: 1.03 mg/dL (ref 0.60–1.30)
Calcium, Total: 8.9 mg/dL (ref 8.5–10.1)
Co2: 30 mmol/L (ref 21–32)
EGFR (African American): >60
EGFR (Non-African Amer.): >60
EOS ABS: 0.1 10*3/uL (ref 0.0–0.7)
Eosinophil %: 1.1 %
GLUCOSE: 91 mg/dL (ref 65–99)
HCT: 41.5 % (ref 35.0–47.0)
HGB: 14 g/dL (ref 12.0–16.0)
Lymphocyte #: 1.3 10*3/uL (ref 1.0–3.6)
Lymphocyte %: 24.7 %
MCH: 31.4 pg (ref 26.0–34.0)
MCHC: 33.6 g/dL (ref 32.0–36.0)
MCV: 93 fL (ref 80–100)
Monocyte #: 0.3 x10 3/mm (ref 0.2–0.9)
Monocyte %: 6.6 %
Neutrophil #: 3.5 10*3/uL (ref 1.4–6.5)
Neutrophil %: 67 %
Osmolality: 279 (ref 275–301)
POTASSIUM: 4.1 mmol/L (ref 3.5–5.1)
Platelet: 199 10*3/uL (ref 150–440)
RBC: 4.45 10*6/uL (ref 3.80–5.20)
RDW: 13 % (ref 11.5–14.5)
SGPT (ALT): 22 U/L
SODIUM: 140 mmol/L (ref 136–145)
Thyroid Stimulating Horm: 1.09 u[IU]/mL
Total Protein: 7.1 g/dL (ref 6.4–8.2)
WBC: 5.2 10*3/uL (ref 3.6–11.0)

## 2014-05-24 LAB — URINALYSIS, COMPLETE
BACTERIA: NONE SEEN
Bilirubin,UR: NEGATIVE
Blood: NEGATIVE
Glucose,UR: NEGATIVE mg/dL (ref 0–75)
Nitrite: NEGATIVE
PROTEIN: NEGATIVE
Ph: 6 (ref 4.5–8.0)
RBC,UR: <1 /HPF (ref 0–5)
Specific Gravity: 1.016 (ref 1.003–1.030)
Squamous Epithelial: 1
WBC UR: 4 /HPF (ref 0–5)

## 2014-05-24 LAB — DRUG SCREEN, URINE

## 2014-05-27 LAB — PATHOLOGY REPORT

## 2014-05-28 ENCOUNTER — Ambulatory Visit: Payer: Self-pay | Admitting: Neurology

## 2014-05-29 LAB — LITHIUM LEVEL: Lithium: 1.1 mmol/L

## 2014-05-31 LAB — BASIC METABOLIC PANEL
Anion Gap: 5 — ABNORMAL LOW (ref 7–16)
BUN: 22 mg/dL — ABNORMAL HIGH (ref 7–18)
CALCIUM: 9.8 mg/dL (ref 8.5–10.1)
Chloride: 101 mmol/L (ref 98–107)
Co2: 30 mmol/L (ref 21–32)
Creatinine: 1.27 mg/dL (ref 0.60–1.30)
EGFR (African American): 57 — ABNORMAL LOW
EGFR (Non-African Amer.): 49 — ABNORMAL LOW
Glucose: 105 mg/dL — ABNORMAL HIGH (ref 65–99)
Osmolality: 276 (ref 275–301)
POTASSIUM: 4.7 mmol/L (ref 3.5–5.1)
SODIUM: 136 mmol/L (ref 136–145)

## 2014-05-31 LAB — LITHIUM LEVEL: Lithium: 1.16 mmol/L

## 2014-06-14 DIAGNOSIS — K2100 Gastro-esophageal reflux disease with esophagitis, without bleeding: Secondary | ICD-10-CM | POA: Insufficient documentation

## 2014-07-04 DIAGNOSIS — Z86010 Personal history of colonic polyps: Secondary | ICD-10-CM | POA: Insufficient documentation

## 2014-08-20 IMAGING — CT CT STONE STUDY
1 of 2 series · 15 of 32 positions shown, 19 images · non-contrast
Comparison: none

REASON FOR EXAM: R FLANK PAIN
COMMENTS:   May transport without cardiac monitor

PROCEDURE:     CT  - CT ABDOMEN /PELVIS WO (STONE)  - February 19, 2013  [DATE]
RESULT:     Comparison: None
TECHNIQUE: Multiple axial images from the lung bases to the symphysis pubis
were obtained without oral and without intravenous contrast.

[Series 2: 3mm soft tissue · axial · 0.68mm/px · z∈[-400,-26]mm · 15 of 137 slices shown, 19 images]
[im 6/137  soft-tissue]
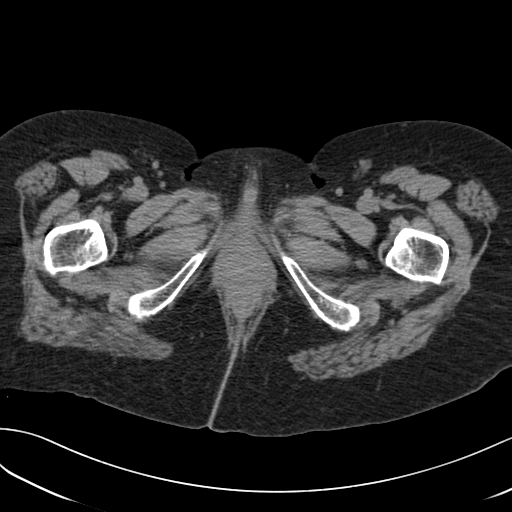
[im 6/137  bone]
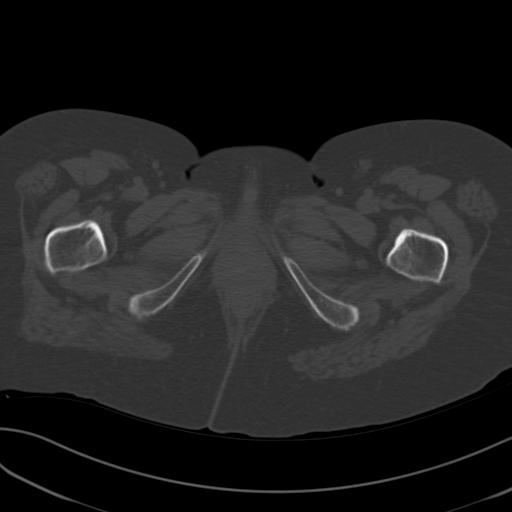
[im 18/137  soft-tissue]
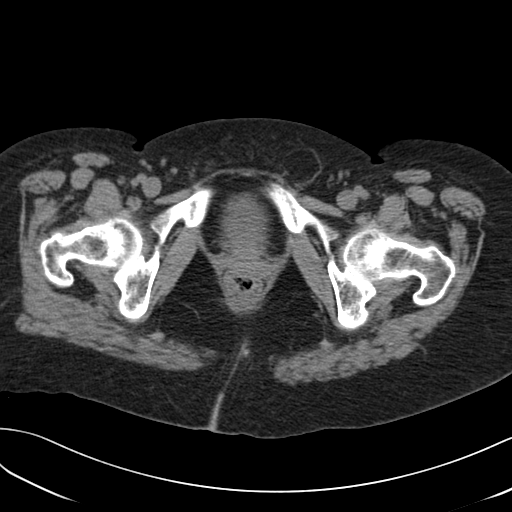
[im 29/137  soft-tissue]
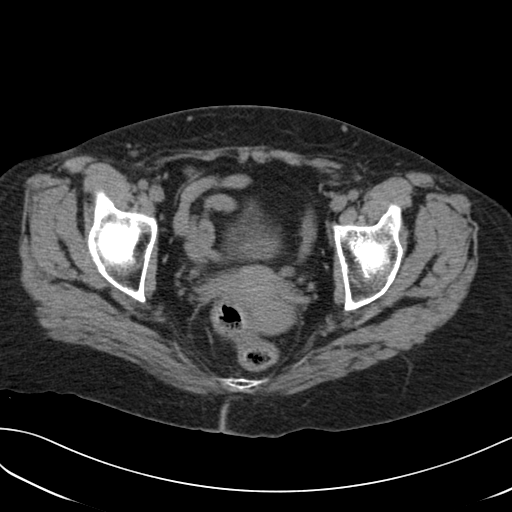
[im 40/137  soft-tissue]
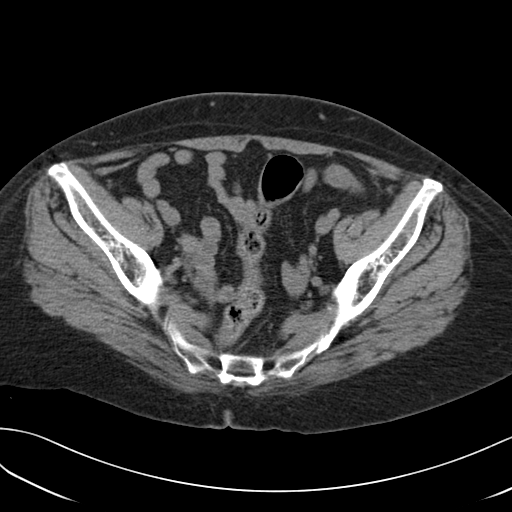
[im 46/137  soft-tissue]
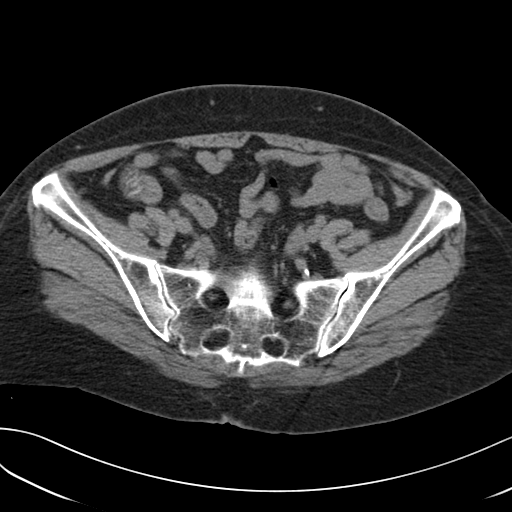
[im 57/137  soft-tissue]
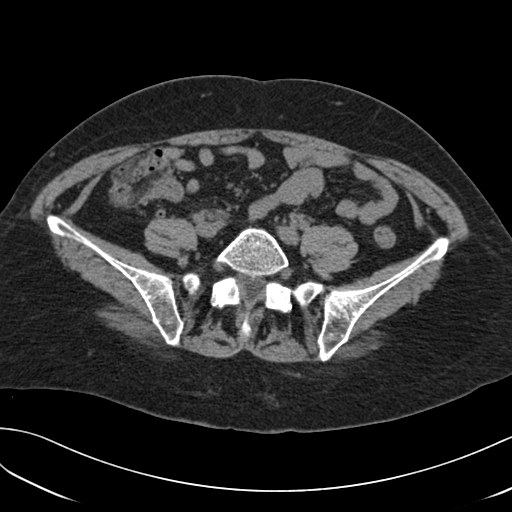
[im 69/137  soft-tissue]
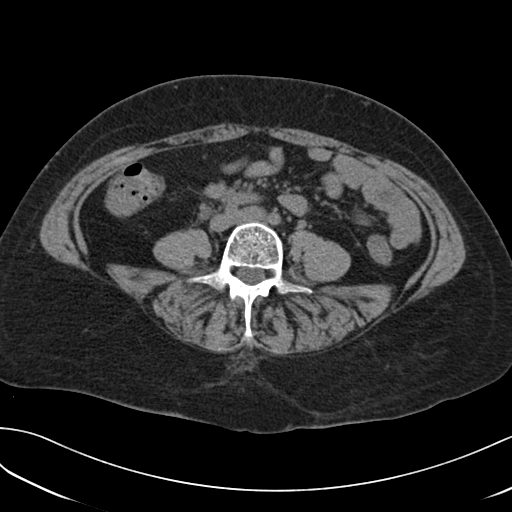
[im 80/137  soft-tissue]
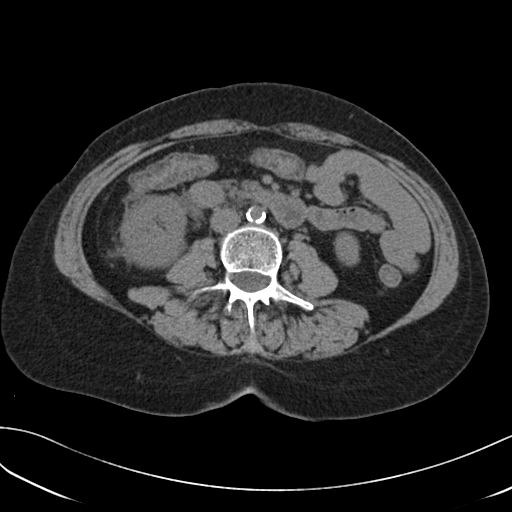
[im 91/137  soft-tissue]
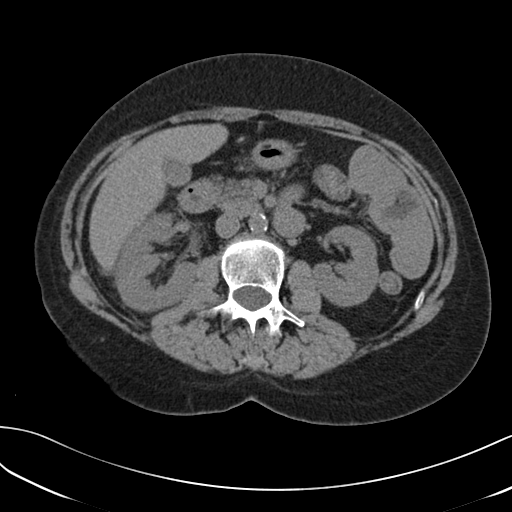
[im 91/137  bone]
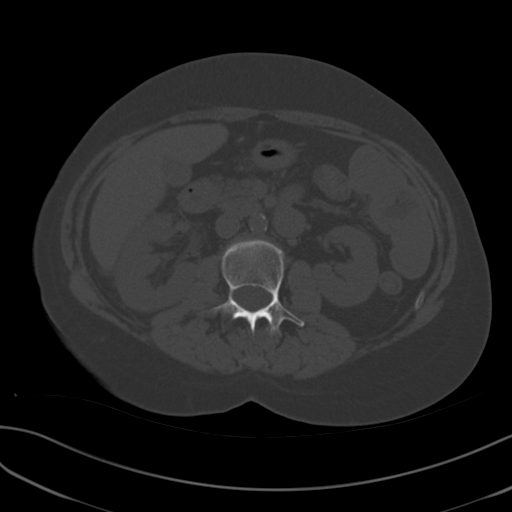
[im 97/137  soft-tissue]
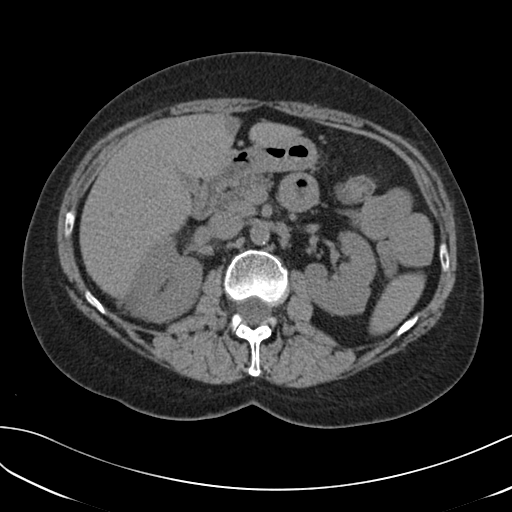
[im 108/137  soft-tissue]
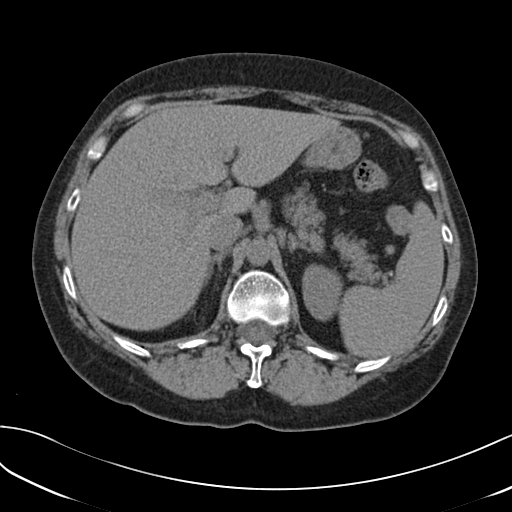
[im 114/137  lung]
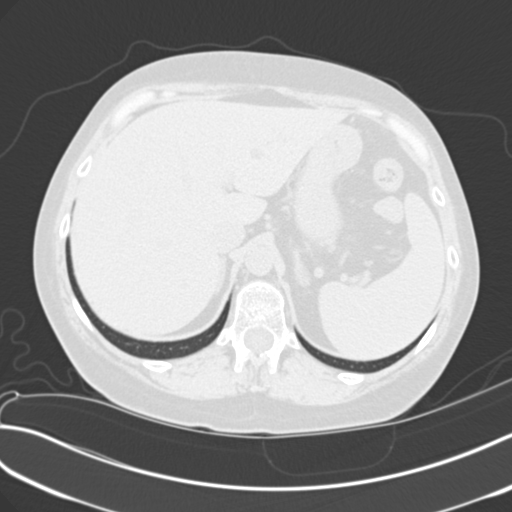
[im 120/137  soft-tissue]
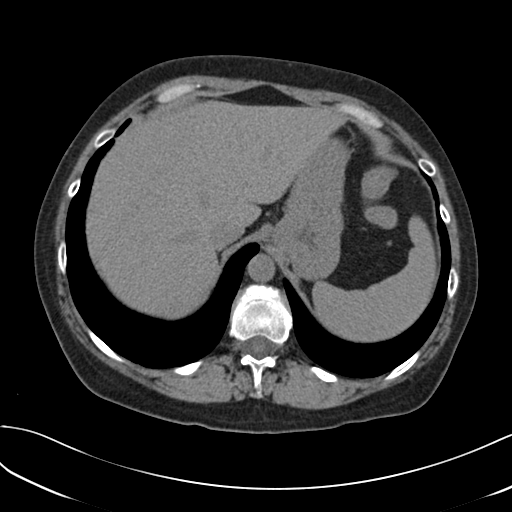
[im 120/137  lung]
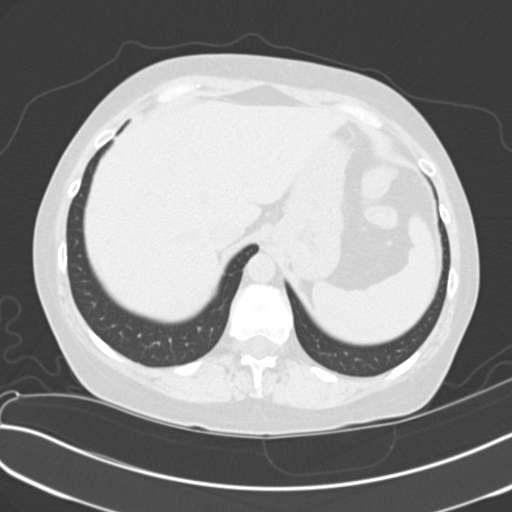
[im 125/137  lung]
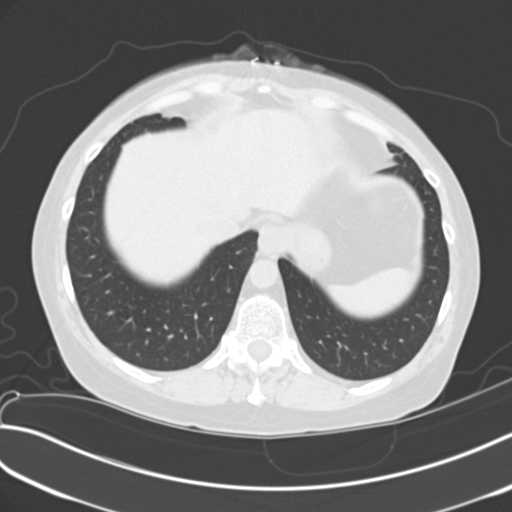
[im 131/137  soft-tissue]
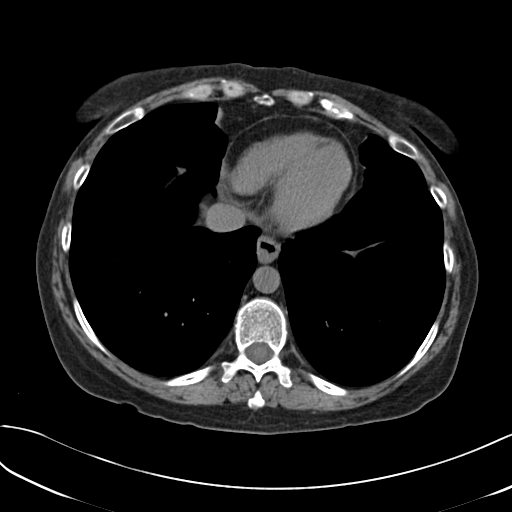
[im 131/137  lung]
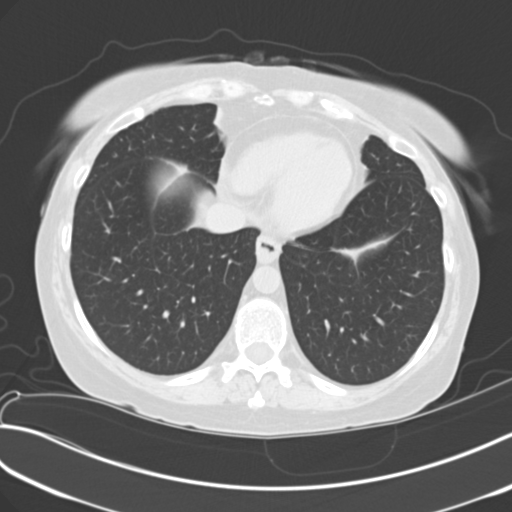

[15 of 32 positions shown; findings below may reference images not displayed]

FINDINGS: Lack of intravenous contrast limits evaluation of the solid abdominal
organs.  Small low-attenuation lesion in the dome of the left hepatic lobe
likely represents a cyst. Other low-attenuation focus in the left hepatic
lobe is too small to characterize. Low-attenuation along the falciform
ligament is likely due to focal fatty deposition. The gallbladder, spleen,
adrenals, and pancreas are unremarkable. There is mild right perinephric
stranding an hydronephrosis. Mild right ureterectasis. There is a 3 mm
calculus at the right ureterovesical junction. A 3 mm calculus is seen in
the left kidney. No hydronephrosis of the left kidney.

The small and large bowel are normal in caliber. The appendix is at the
upper limits of normal in diameter, measuring 6-7 mm in diameter. There are
no adjacent inflammatory changes and there is some air within the body of
the appendix. There is some high attenuation within the appendix which may
represent inspissated material.

No aggressive lytic or sclerotic osseous lesions are identified.
IMPRESSION: 3 mm calculus at the right ureterovesical junction causing mild proximal
obstruction.

## 2015-01-03 NOTE — Discharge Summary (Signed)
PATIENT NAME:  Shirley [name], [name] MR#:  [id number] DATE OF BIRTH:  27-Dec-1960  PRIMARY CARE PHYSICIAN ON ADMISSION: Unknown.   PSYCHIATRIST: Dr. Toni Amendlapacs.  Please see interim discharge summary dictated by Dr. Auburn BilberryShreyang Patel on 05/07/2013.   FINAL DIAGNOSES:  1.  Metabolic encephalopathy.  2.  Hypertension.  3.  Acute respiratory failure.  4.  Suicide attempt with salicylate poisoning.  5.  Hypokalemia.  6.  Hyponatremia.   MEDICATIONS ON DISCHARGE TO PSYCHIATRY UNIT: Include melatonin 3 mg at bedtime, trazodone 100 mg one tablet at bedtime, olanzapine 10 mg at bedtime, Colace 10 mg/mL oral liquid 5 mL daily.   DIET: Regular diet with fluid restriction of 1500 mL.  Diet is regular consistency.   ACTIVITY: As tolerated.   FOLLOW-UP: In 1 to 2 days with Dr. Toni Amendlapacs on the psychiatry floor.   Please see interim discharge summary for hospital course up until 05/07/2013.   This will be an interim summary on the 26th and the 27th.   LABORATORY DATA: On the 26th included a phosphorus of 3.1, magnesium 2.2, glucose 103, BUN 24, creatinine 0.65, sodium 126, potassium 4.0, chloride 90, CO2 29, calcium 10.1, O2 saturation 95% on room air. Urine, random, sodium 36.   On the 27th, sodium was up to 133, potassium 3.3, magnesium 2.1.   At the time of discharge to the psychiatric unit, the patient's metabolic encephalopathy had improved. The patient was talking coherently.   For the patient's hypotension, blood pressure was 121/82. That had improved.   For the patient's acute respiratory failure, they were off oxygen on the 26th of August. That had improved.   For the patient's suicide attempt with salicylate poisoning, Dr. Toni Amendlapacs wanted to follow up on the psychiatric unit.   For the patient's hypokalemia, I did replace the potassium.   For the patient's hyponatremia, a fluid restriction was done and sodium came up to 133.   TIME SPENT ON DISCHARGE: 35 minutes:     ____________________________ Herschell Dimesichard J. Renae GlossWieting, MD rjw:np D: 05/09/2013 16:26:27 ET T: 05/09/2013 19:47:58 ET JOB#: [id number]  cc: Herschell Dimesichard J. Renae GlossWieting, MD, <Dictator> Audery AmelJohn T. Clapacs, MD

## 2015-01-03 NOTE — H&P (Signed)
PATIENT NAME:  Shirley [name], Shirley [name] MR#:  [id number] DATE OF BIRTH:  [birth year]  DATE OF ADMISSION:  01/23/2013  IDENTIFYING INFORMATION AND CHIEF COMPLAINT: A 54 year old woman with a history of psychotic depression admitted to the hospital with a chief complaint "Can I please be admitted."   HISTORY OF PRESENT ILLNESS: Information obtained from the patient and the chart. The patient came to my office for a scheduled appointment today reporting that she was feeling extremely depressed. This has been coming on for several days. She says that she feels like she is having a panic attack 24 hours a day. She feels anxious and nervous and worried all the time. She feels jumpy inside, feels like she cannot think clearly. She has started to have some suicidal thoughts without a specific plan. She is feeling frightened and feels like she cannot take care of herself. She has not slept for a couple of days and she has not been able to eat for a couple of days. She has not been able to go to work for probably a week now. One precipitant may be that her husband has recently left town again to go on an out-of-town jobs, which always  gets upset. She has been compliant with her current medication. The patient came to the Emergency Room just yesterday with similar complaints, but at that time was not deemed to require admission. On my re-evaluation today the patient feels out of control, hopeless and having suicidal thoughts.   PAST PSYCHIATRIC HISTORY: The patient has had a history of recurrent episodes of psychotic depression with obsessive-type symptoms. The patient has had two previous hospitalizations at our facility that I am aware of. She has not shown clear manic symptoms, but seems to have recurrent severe depressions. The current symptoms she is having are typical of her depressions. In the past, she has responded to a combination of Zyprexa and antidepressants. She had previously been on Prozac which within the last  year was changed to mirtazapine to try and better control her depressive symptoms. Despite that, she now seems to be relapsing.   SUBSTANCE ABUSE HISTORY: The patient denies use of alcohol or any other intoxicating drugs and does not have a history of substance abuse.   PAST MEDICAL HISTORY: The patient has struggled at times with her weight, but has no significant ongoing known medical problems.   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  When she is not able to drive, she is not able to do her job. This makes things even more upsetting for her. Her husband is a long Designer, fashion/clothing who is frequently out of town for extended periods of time. The patient does have a mother who lives close by and the daughter who lives with her.    CURRENT MEDICATIONS: Zyprexa 20 mg p.o. at bedtime, mirtazapine 45 mg p.o. at bedtime, trazodone 100 mg p.o. at bedtime, Remeron 0.5 mg t.i.d. p.r.n. anxiety.   ALLERGIES: No known drug allergies.   REVIEW OF SYSTEMS: Depressed mood, fatigue, lack of interest in activities, feeling extremely anxious and jittery. She cannot concentrate. Passive suicidal thoughts with wishes that she would die. No hallucinations or delusions evident. Unable to eat or sleep for the last couple of days.   MENTAL STATUS EXAMINATION: The patient is a slightly disheveled woman who looks older than her stated age. Eye contact is poor right now. Psychomotor activity is very sluggish and slow. Speech quiet and slow. Affect is  flat and dysphoric and anxious-looking. Mood stated as bad. Thoughts are slow and fairly concrete. No evidence of clear delusions. No loosening of associations. Denies hallucinations. Positive suicidal ideation without specific plan. No homicidal ideation. Judgment and insight adequate at least in terms of coming for treatment rather than staying away. Normal intelligence at baseline.   PHYSICAL EXAMINATION: GENERAL: The patient  appears to probably gained weight. Her current weight is 165 pounds. Her skin is leathery, but without any specific skin lesions.  HEENT: Pupils equal and reactive. Face symmetric.  MUSCULOSKELETAL: Full range of motion at all extremities. Normal gait.  NECK: Full range of motion.  BACK AND NECK: Nontender. Normal strength and reflexes throughout. Cranial nerves intact and symmetric.  LUNGS: Clear with no wheezes.  HEART: Regular rate and rhythm.  ABDOMEN: Soft, nontender, normal bowel sounds.  VITAL SIGNS: Most recent show temperature 98.5, pulse 97, respirations 18, blood pressure 124/66.   LABORATORY RESULTS: Chemistry panel all normal except for a very slightly elevated glucose at 102. Alcohol not detected. CBC normal. Salicylates slightly high, but not toxic. TSH normal at 0.69. Drug screen all negative. Urinalysis unremarkable.   ASSESSMENT:  A 54 year old woman with severe, recurrent psychotic depression. Requires hospitalization. Has been responsive to medication in the past, but has sometimes taken a long time. She sees herself as being primarily anxious, but does not appear to really be having a typical panic attacks. The patient requires hospitalization because of suicidal ideation, lack of a safe environment at home, failure of outpatient treatment, failure to eat or sleep adequately.   TREATMENT PLAN: Admit to the hospital. I immediately gave her 2 mg of Xanax to try and calm down her acute anxiety and see if we could get her some sleep. Continue current medicines for now. Re-evaluate medicines and probably change antidepressants. The patient may be a candidate for ECT and we will discuss that tomorrow was well.   DIAGNOSIS PRINCIPAL AND PRIMARY:  AXIS I: Major depression severe, recurrent, psychotic.   SECONDARY DIAGNOSES:    AXIS I: Obsessive-compulsive trait.   AXIS II: No diagnosis.   AXIS III: No diagnosis.   AXIS IV: Moderate, chronic stress from social isolation.    AXIS V: Functioning at time of evaluation 30.   ____________________________ Audery AmelJohn T. Clapacs, MD jtc:cc D: 01/23/2013 18:11:45 ET T: 01/23/2013 18:35:33 ET JOB#: [id number]  cc: Audery AmelJohn T. Clapacs, MD, <Dictator> Audery AmelJOHN T CLAPACS MD ELECTRONICALLY SIGNED 01/24/2013 22:17

## 2015-01-03 NOTE — Consult Note (Signed)
Psychiatry: Patient seen. She is still intubated and sedated and not responsive to speaking her name. Evidently extubation was tried today but was not sucessful. Family not here when I came by. No new sugestions. Will follow.  Electronic Signatures: Audery Amellapacs, Calene Paradiso T (MD)  (Signed on 21-Aug-14 17:42)  Authored  Last Updated: 21-Aug-14 17:42 by Audery Amellapacs, Aldridge Krzyzanowski T (MD)

## 2015-01-03 NOTE — Consult Note (Signed)
Brief Consult Note: Diagnosis: MDD, Anxiety DO.   Patient was seen by consultant.   Recommend further assessment or treatment.   Discussed with Attending MD.   Comments: Pt seen in ED [facility]. She was recently D/C from inpt unit and presented again seeking anxiety medication. [facility] medications are not helping her and she is feeling very anxious. She was given Lorazepam which has helped her. She denied having any Si/HI or plans.  Case discussed with Dr Toni Amendlapacs who will be prescribing her Klonopin 0.5 mg BID till she has her f/u appoitment in his outpt clinic.  Advised pt of the same and she demonstarted undertstanding.   Plan: Pt will be d/c from ED.  She was given prescription of Klonopin 0.5mg  BID, faxed to Pharmacy by Dr Toni Amendlapacs.  She has an appointment on 05/17/13 - 2 pm with Dr Toni Amendlapacs.  Electronic Signatures: Rhunette CroftFaheem, Solene Hereford S (MD)  (Signed 02-Sep-14 15:21)  Authored: Brief Consult Note   Last Updated: 02-Sep-14 15:21 by Rhunette CroftFaheem, Mailani Degroote S (MD)

## 2015-01-03 NOTE — Consult Note (Signed)
Psychiatry: Came by to followup with the patient. She is still on the ventilator and unable to communicate or give any further history. I reviewed the chart and the current notes. I will continue to all up with the patient and continue to think that once she is medically off the ventilator and improved we can consider transfer to psychiatry. No further change to psychiatric recommendation at this point.  Electronic Signatures: Audery Amellapacs, Coree Brame T (MD)  (Signed on 20-Aug-14 20:52)  Authored  Last Updated: 20-Aug-14 20:52 by Audery Amellapacs, Emerson Schreifels T (MD)

## 2015-01-03 NOTE — Consult Note (Signed)
Psychiatry: Followup on this patient with severe recurrent depression status post a suicide attempt. Today the patient is alert and awake. She totally denies any suicidal ideation. She has not behaved in a suicidal manner in the hospital. Family does not feel that she is at acute suicide risk. I think we can discontinue the one-to-one sitter safely. I am restarting her Zyprexa and Prozac and increasing the dose of trazodone to 100 mg at night. I will continue to followup and when she is medically stable enough we can consider transfer to psychiatry. Patient is aware of the plan.  Electronic Signatures: Audery Amellapacs, Keon Benscoter T (MD)  (Signed on 25-Aug-14 18:11)  Authored  Last Updated: 25-Aug-14 18:11 by Audery Amellapacs, Seville Downs T (MD)

## 2015-01-03 NOTE — Discharge Summary (Signed)
PATIENT NAME:  Shirley [name], Shirley [name] MR#:  [id number] DATE OF BIRTH:  1961/01/06  DATE OF ADMISSION:  05/09/2013 DATE OF DISCHARGE:   05/11/2013  HOSPITAL COURSE: See dictated history and physical for details of admission. A [age] year old woman with a history of recurrent depression, who was transferred from the medical service after recovering from a suicide attempt. She had evidently tried to kill herself by overdose of BC powders and developed a metabolic ketoacidosis and required several days of intubation. The patient has maintained that she does not remember doing it. She does not remember having any suicidal ideation. She has denied suicidal ideation since being here in the hospital. Her affect when she first came down was still blunted and anxious, but not nearly as  badly as it was last time she was on the behavioral health unit. She has consistently denied suicidal ideation. She remained physically weak, recovering from her time being intubated, and had a minor fall related to orthostatic hypotension her first day here. Fortunately, she did not suffer any sort of injury. Subsequently, she has been eating and drinking better, although she says her appetite is still not great. Her blood pressure has remained more stable, and she has not have another fall or complaints of dizziness. The patient's mood has improved. In addition to consistently denying suicidal ideation, she has reported that she is feeling a little bit better, more upbeat and less anxious than she was before. She has participated in some groups, even though typically she does not like to do this because of her social anxiety. She has been cooperative with treatment on the unit. Her hygiene and grooming have improved. At this point, the only medication we are using is Zoloft 50 mg a day, trazodone 100 mg at night and melatonin 3 mg at night. Since she had been off psychiatric medicines while in the critical care unit, we discussed simplifying and  changing her medicine and trying a different serotonin reuptake inhibitor. She was agreeable to this, and at this point this will be her medicine regimen until she comes back to me in the outpatient clinic. She is to call and make an appointment, hopefully, for next week or the following week after that if necessary. She can always get in touch with my office sooner if needed. She is completely agreeable to all of this. She has been educated about the importance of not overdosing on any medicine, including over-the-counter medicine.   DISCHARGE MEDICATIONS: Zoloft 50 mg p.o. daily, trazodone 100 mg p.o. at bedtime, melatonin 3 mg p.o. at bedtime.   LABORATORY RESULTS: Labs since coming down to our unit have included a followup sodium urine random, which was 36 and improved. Blood sodium  up to 133. Potassium is still a little bit down. These were on first admission. On the 28th, we rechecked labs and found that  her potassium was normal. Sodium had come up to 135.   MENTAL STATUS EXAMINATION: Neatly dressed and groomed woman, looks her stated age, cooperative with the interview. Good eye contact. Normal psychomotor activity. Speech still a little bit quiet but not nearly as much so as she often is.  Much more normal. Affect smiling, reactive, appropriate. Mood stated as being better. Thoughts are lucid without any obvious loosening of associations or delusions. Denies auditory or visual hallucinations. Denies suicidal or homicidal ideation. Shows improved insight and judgment. Normal intelligence. Alert and oriented x 4.   DISPOSITION: Discharge home with family. Follow up with me in the  office next week. We are stopping ECT treatment at this time because of her request.   DIAGNOSIS, PRINCIPAL AND PRIMARY:  AXIS I: Major depression, severe, recurrent.   SECONDARY DIAGNOSES: AXIS I: Generalized anxiety disorder, social anxiety disorder.  AXIS II: Deferred.  AXIS III: Hyponatremia, probably as a result  of her time in the critical care unit and her overdose, which is recovering. Status post overdose of aspirin.  AXIS IV: Moderate from recent pressure to work, which had been coming for a great deal of the time from herself. Chronic illness. Some financial difficulties.  AXIS V: Functioning at time of discharge is 55.    ____________________________ Audery Amel, MD jtc:dmm D: 05/11/2013 13:06:02 ET [name]: 05/11/2013 13:17:30 ET JOB#: [id number]  cc: Audery Amel, MD, <Dictator> Audery Amel MD ELECTRONICALLY SIGNED 05/11/2013 17:00

## 2015-01-03 NOTE — Consult Note (Signed)
PATIENT NAME:  Shirley [name], Shirley [name] MR#:  [id number] DATE OF BIRTH:  07-17-1961  DATE OF CONSULTATION:  05/01/2013  CONSULTING PHYSICIAN:  [name]. Dellamae Rosamilia, MD  IDENTIFYING INFORMATION AND REASON FOR CONSULT: A 54 year old woman currently in the Critical Care Unit for treatment of an overdose. Consultation for psychiatric evaluation and treatment.   HISTORY OF PRESENT ILLNESS: This is a  54 year old woman well known to me. She sees me as an outpatient in my office and also has been seeing me for electroconvulsive therapy treatment. Yesterday afternoon, I received a phone call from the patient's husband, who told me that the patient was lethargic and appeared "drunk" he was afraid that she may have taken an overdose of her medicine and was already in the process of calling 911 to have an ambulance bring her to the Emergency Room. The patient was brought to the Emergency Room and apparently was lethargic at that point, to the degree of needing to be intubated. Based on lab workup, the belief at this point is that she has a salicylate toxicity from overdose of BC powders. There could be other drugs involved as well. The patient was not able to talk to me today. She is intubated and sedated. The husband had been out of town on a job for his work, recently. He did not know of any recent change in the patient's mental health status. He was surprised to find when he got home from his work a couple of days ago that the patient appeared to be already more sedated than usual. He denies that she had made any statements that he was aware of about suicide. I spoke to the patient's mother as well who lives in town and feels like she had been in closer contact with the patient recently. She tells me that the patient had been making more negative and depressed statements over the last day had been saying that nobody cared for her, making a what sound like clearly depressive comments. Mother says that she had been afraid that the  patient might take an overdose or try to kill herself. There is no known new specific stressor, although the patient's depression has been bad recently and she has been struggling to try and continue to do her job, but has not been able to keep up with work. She has chronic feelings of guilt and inadequacy about that, I believe.   PAST PSYCHIATRIC HISTORY: The patient has a history of recurrent episodes of severe major depression. The possibility of bipolar disorder has been considered in the past, but I have not seen or found any history of a clear manic episode. The patient also has chronic severe anxiety symptoms and occasional panic attack-like symptoms. She previously had been responsive to serotonin reuptake inhibitors but in the last year or so has decompensated to the point where medication no longer was treating her affectively. She had been in the hospital earlier in the summer for an episode of depression, during which time we start electroconvulsive therapy. The patient had a partial response to electroconvulsive therapy, although she did seem to probably have some memory impairment. We have been trying to maintain her gains with a combination of medicine and maintenance ECT treatment. The patient has been struggling to try and continue to do her job, but has not been able to stay on the job consistently and has had trouble actually doing the tasks that are required because of slowness and probably cognitive impairment. This has been extra factor  making her more depressed.   SUBSTANCE ABUSE HISTORY: The patient does not have a history of alcohol or drug abuse.   SOCIAL HISTORY: The patient is married, as one daughter, who is a late teenager. The patient and her daughter live together. The husband lives there when he is in town, but he is employed doing Holiday representative work that frequently takes him out of town for 1 to 2 weeks at a time on the job. The patient has other family here in the area,  especially her mother who are supportive. 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   FAMILY HISTORY: Extensive family history of depression. Several people on her father's side of the family committed suicide.   PAST MEDICAL HISTORY: The patient has had gastric reflux symptoms, but otherwise is fairly medically healthy. No significant ongoing medical problems otherwise.   REVIEW OF SYSTEMS: The patient is not able to give any information at this time.   MENTAL STATUS EXAMINATION: The patient is sedated, unarousable. She is on a ventilator. Not able to communicate at all.   LABORATORY RESULTS: TSH was low at 0.4. Alcohol undetectable, her total protein is low at 5.5, albumin low at 2.8, sodium normal, but chloride elevated, calcium low at 6.3. Drug screen is negative, including being negative for benzodiazepines, which may suggest that she did not overdose on her clonazepam.   CURRENT MEDICATIONS: Clonazepam 0.5 mg b.i.d., Prozac 60 mg per day, Zyprexa 20 mg at night, hydroxyzine 50 mg q. 6 hours p.r.n. for anxiety.   ASSESSMENT: A 54 year old woman with a history of severe, recurrent major depression. The history altogether, including what I am hearing from her mother would suggest a high likelihood that this was a suicide attempt. Right now I am not able to get any direct information from the patient. At this point, obviously it is not of primary importance to recreate her psychiatric medicines. I did not add any new orders. Her dose of benzodiazepine is so low I do not think we need to worry about any withdrawal. The patient will probably need transferred to psychiatry once she is medically stabilized.   TREATMENT PLAN: No change to medication. Supportive and educational therapy done with the husband and mother. We will continue to follow the patient daily. Once she is medically stable for  transfer, we can consider transfer to the behavioral health unit, where I will recommend resuming 3 times a week ECT as well as re-evaluating her medicine.   DIAGNOSIS, PRINCIPAL AND PRIMARY:   AXIS I: Major depression, severe, recurrent.   SECONDARY DIAGNOSES: AXIS I: Generalized anxiety disorder.   AXIS II: Deferred.   AXIS III: History of dyslipidemia, current overdose group mainly and salicylates.   AXIS IV: Severe from her chronic illness.   AXIS V: Functioning at time of evaluation 20.    ____________________________ Audery Amel, MD jtc:cc D: 05/01/2013 14:21:07 ET [name]: 05/01/2013 15:58:55 ET JOB#: [id number]  cc: Audery Amel, MD, <Dictator> Audery Amel MD ELECTRONICALLY SIGNED 05/01/2013 17:27

## 2015-01-03 NOTE — Consult Note (Signed)
Psychiatry: Followup note for this patient with a history of recurrent depression who is recovering from an overdose of aspirin. She is no longer requiring oxygen. She tells me today that she is feeling better. Not having a significant amount of pain. She says that her mood and nerves are feeling okay. She appears to be minimizing her symptoms to some degree. She denies again that she was trying to kill her self. I pointed out that the overdose was of an extraordinarily large volume which makes it hard to believe that it was an accident. She did not does this. She also at knowledge is that she still has serious issues with depression and anxiety. and educational therapy done. No change to her current medications. I spoke with Dr. Hilton SinclairWeiting today about possible transfer of the patient to psychiatry. During our discussion it was noticed that she continues to be hyponatremic. It's not clear what that's from. It is possible that the Prozac may play a role. We will hold off on the Prozac for the moment. He will monitor her electrolytes. Once she is medically fully stable we can consider transfer to psychiatry.  Electronic Signatures: Audery Amellapacs, Fayelynn Distel T (MD)  (Signed on 26-Aug-14 18:12)  Authored  Last Updated: 26-Aug-14 18:12 by Audery Amellapacs, [place] Fassnacht T (MD)

## 2015-01-03 NOTE — Consult Note (Signed)
Brief Consult Note: Diagnosis: left pupillary dilation.   Patient was seen by consultant.   Consult note dictated.   Comments: - pt presented with salicylate poisoning - likely suicidal noted to have left pupillary dilation, neg CT head. - no known h/o of anisocoria (or mono-ocular transient visual loss) - as there was concern for afferent pupillary defect. - Ideally would like to get CT angio of intracranial vessels to rule out aneurysm affecting 3rd nerve, will ask Dr. Cherylann RatelLateef - to see if contrast is safe from renal purpose. - If not possible - would like to get MRI brain with and without conrast once pt extubated and able to stay still for long time (if left mydriasis is still present) - Most likely left mydriasis (well reactive to light) is from anticholinergic medication effect. - exam non focal but very limited exam as pt intubated and mildly sedated. - depression - per psych - will follow remotely.  Electronic Signatures: Jolene ProvostShah, Marquan Vokes Kalpeshkumar (MD)  (Signed 19-Aug-14 22:16)  Authored: Brief Consult Note   Last Updated: 19-Aug-14 22:16 by Jolene ProvostShah, Rylea Selway Kalpeshkumar (MD)

## 2015-01-03 NOTE — Discharge Summary (Signed)
PATIENT NAME:  Shirley [name], Shirley [name] MR#:  [id number] DATE OF BIRTH:  March 28, 1961  PRIMARY CARE PHYSICIAN ON ADMISSION: Unknown.   PSYCHIATRIST: Dr. Toni Amendlapacs.  Please see interim discharge summary dictated by Dr. Auburn BilberryShreyang Patel on 05/07/2013.   FINAL DIAGNOSES:  1.  Metabolic encephalopathy.  2.  Hypertension.  3.  Acute respiratory failure.  4.  Suicide attempt with salicylate poisoning.  5.  Hypokalemia.  6.  Hyponatremia.   MEDICATIONS ON DISCHARGE TO PSYCHIATRY UNIT: Include melatonin 3 mg at bedtime, trazodone 100 mg one tablet at bedtime, olanzapine 10 mg at bedtime, Colace 10 mg/mL oral liquid 5 mL daily.   DIET: Regular diet with fluid restriction of 1500 mL.  Diet is regular consistency.   ACTIVITY: As tolerated.   FOLLOW-UP: In 1 to 2 days with Dr. Toni Amendlapacs on the psychiatry floor.   Please see interim discharge summary for hospital course up until 05/07/2013.   This will be an interim summary on the 26th and the 27th.   LABORATORY DATA: On the 26th included a phosphorus of 3.1, magnesium 2.2, glucose 103, BUN 24, creatinine 0.65, sodium 126, potassium 4.0, chloride 90, CO2 29, calcium 10.1, O2 saturation 95% on room air. Urine, random, sodium 36.   On the 27th, sodium was up to 133, potassium 3.3, magnesium 2.1.   At the time of discharge to the psychiatric unit, the patient's metabolic encephalopathy had improved. The patient was talking coherently.   For the patient's hypotension, blood pressure was 121/82. That had improved.   For the patient's acute respiratory failure, they were off oxygen on the 26th of August. That had improved.   For the patient's suicide attempt with salicylate poisoning, Dr. Toni Amendlapacs wanted to follow up on the psychiatric unit.   For the patient's hypokalemia, I did replace the potassium.   For the patient's hyponatremia, a fluid restriction was done and sodium came up to 133.   TIME SPENT ON DISCHARGE: 35 minutes:     ____________________________ Herschell Dimesichard J. Renae GlossWieting, MD rjw:np D: 05/09/2013 16:26:00 ET [name]: 05/09/2013 19:47:58 ET JOB#: [id number]  cc: [name]. Clapacs, MD Herschell Dimesichard J. Renae GlossWieting, MD, <Dictator>     Salley ScarletICHARD J Cori Justus MD ELECTRONICALLY SIGNED 05/19/2013 14:22

## 2015-01-03 NOTE — Discharge Summary (Signed)
PATIENT NAME:  Shirley [name], Shirley [name] MR#:  [id number] DATE OF BIRTH:  05/21/1961  DATE OF ADMISSION:  01/23/2013 DATE OF DISCHARGE:  02/16/2013   HOSPITAL COURSE: See dictated history and physical for details of admission. This 54 year old woman with a history of major depression versus type II bipolar disorder and chronic anxiety was admitted to the hospital with a worsening of mood and anxiety symptoms and suicidal ideation. She had become unfunctional as an outpatient with constant worrying, decreased concentration, slowed thinking, slowed activity, negative mood, passive suicidal ideation. Although the patient complained of anxiety symptoms, they did not seem to improve with benzodiazepine treatment, and furthermore, she never appeared to be exhibiting a typical mental status of an anxious person, but rather appeared to be severely depressed. Because of her history of partial response to medication and worsening symptoms, I proposed ECT. The patient agreed to ECT, and treatment was started on May 16th. As of today, she has had 9 right unilateral ECT treatments which have been tolerated well and have produced potentially effective seizures. It took her a few treatments before she started to show improvement, but at this point, she clearly is much better. She is no longer fretting and anxious. She does not stay completely confined to her room. She does not talk about feeling anxious and overwhelmed all the time. She is taking care of her hygiene much better. She is denying suicidal ideation. The patient has not had any clear side effects from the ECT. She was taken off of benzodiazepines during her stay to facilitate the ECT and tolerated this well without any withdrawal. She was continued on medications for her mood disorder, including Zyprexa, which is currently dosed at 10 mg twice a day. Also taking atorvastatin, trazodone, fluoxetine and will be restarted on Klonopin. At this point, the patient appears to have  had significant improvement with the ECT and is safe for discharge. No sign of acute dangerousness. I will be out of town for the next 2 weeks, causing an interruption in the ECT schedule, so there does not seem to be any need to keep her in the hospital further. We will plan on seeing her back on the 23rd of June for maintenance ECT. In the meantime, she is to stay on her medicine and will follow up with me as an outpatient.   DISCHARGE MEDICATION: Zyprexa 10 mg p.o. b.i.d., atorvastatin 20 mg once a day, trazodone 100 mg at night, fluoxetine 40 mg once a day, clonazepam 0.5 mg 2 times a day, hydroxyzine 50 mg q.6 hours as needed for anxiety, melatonin 6 mg at night.   LABORATORY RESULTS: Admission labs showed chemistry unremarkable. Alcohol undetected. CBC normal. Salicylates slightly elevated but not toxic. TSH normal at 0.69. Drug screen negative. Urinalysis unremarkable.   MENTAL STATUS EXAM AT DISCHARGE: Neatly dressed and groomed woman, looks her stated age. Cooperative with the interview. Good eye contact, normal psychomotor activity. Speech still a little bit slowed, but not nearly as bad as it was before. Tone normal, easy to communicate with. Affect still a little bit constricted, but much better than previously. She is able to smile appropriately and interact with more appropriate tone. Mood stated as being okay. Thoughts appear to be generally lucid. No sign of loosening of associations or delusions. Denies auditory or visual hallucinations. Denies suicidal or homicidal ideation. Agrees to outpatient treatment plan. Appears to be alert and oriented x4. Normal intelligence. Able to make appropriate treatment decisions.   DIAGNOSIS, PRINCIPAL AND PRIMARY:  AXIS I: Major depression, severe, recurrent, with psychotic features.   SECONDARY DIAGNOSIS:  AXIS I: Rule out bipolar disorder type II.  AXIS II: Deferred.  AXIS III: Dyslipidemia.  AXIS IV: Moderate to severe from chronic isolation at  home and job stress.  AXIS V: Functioning at time of discharge 56.   ____________________________ Shirley Amel, MD jtc:OSi D: 02/16/2013 13:12:43 ET [name]: 02/16/2013 13:31:06 ET JOB#: [id number]  cc: Shirley Amel, MD, <Dictator> Shirley Amel MD ELECTRONICALLY SIGNED 02/17/2013 14:20

## 2015-01-03 NOTE — Consult Note (Signed)
PATIENT NAME:  Shirley [name], Shirley [name] MR#:  [id number] DATE OF BIRTH:  [birth year]  DATE OF CONSULTATION:  05/01/2013  REFERRING PHYSICIAN:  Carney Corners. Rudene Re, MD CONSULTING PHYSICIAN:  Justus Duerr Lizabeth Leyden, MD  REASON FOR CONSULTATION: Salicylate toxicity.   HISTORY OF PRESENT ILLNESS: The patient is a 54 year old Caucasian female with past medical history of obsessive-compulsive disorder, depression, hyperlipidemia, anxiety, who presented to [facility] yesterday with altered mental status. The patient unfortunately is unable to offer any history at this point in time. The patient's husband is currently at the bedside. He reports that the patient took an unknown amount of BC Powders. He is not sure how many packets of this she took. Upon presentation here, the patient was found to have altered mental status. Initial salicylate level was 63.7. We were notified of this last evening. At that point in time, we recommend initiation of bicarbonate drip given the presence of metabolic acidosis as pH was 7.19. Repeat level was 66.5. The patient was intubated for airway protection. A third salicylate level is currently pending. The patient's creatinine currently is 0.84. She is making urine at this point in time, and urine output thus far has been 700 mL. The patient's current blood pressure is 104/60. She is requiring 40% of oxygen on the ventilator. The pH is also improved this morning up to 7.34.   PAST MEDICAL HISTORY:  1. Obsessive-compulsive disorder.  2. Depression.  3. Hyperlipidemia.  4. Anxiety.  5. Tobacco abuse   SOCIAL HISTORY: The patient is married. She works for the [employer]. She has history of tobacco abuse and smokes at least 1.5 packs of cigarettes per day. No reported alcohol or illicit drug use.   FAMILY HISTORY: Currently unobtainable as the patient is intubated and sedated at present.   REVIEW OF SYSTEMS: Currently unable to obtain as the patient is intubated and  sedated.   PHYSICAL EXAMINATION:  VITAL SIGNS: Temperature 98.3, pulse 76, respirations 22, blood pressure 104/60.  GENERAL: Revealed a critically ill-appearing female, lying in bed.  HEENT: Normocephalic, atraumatic. No spontaneous extraocular movements noted at present. In regards to the pupils, the left pupil was slightly larger than the right. Left pupil was approximately 6 mm and reactive, and the right pupil was 3 mm and reactive. No epistaxis was noted. Endotracheal tube was noted to be in place.  NECK: Supple, without JVD or lymphadenopathy.  LUNGS: Demonstrate rhonchi and wheezing bilaterally. Breaths are currently vent assisted.  CARDIOVASCULAR: S1, S2, regular rate and rhythm. No murmurs, rubs or gallops appreciated.  ABDOMEN: Soft, nontender, nondistended. Bowel sounds positive. No rebound or guarding. No gross organomegaly appreciated.  EXTREMITIES: No clubbing, cyanosis or edema.  NEUROLOGIC: The patient is currently intubated and sedated. However, she does withdraw to painful stimuli.  SKIN: Warm and dry. No rashes noted.  MUSCULOSKELETAL: No joint redness, swelling or tenderness appreciated.   GENITOURINARY: Foley catheter noted to be in place. No suprapubic tenderness is noted at this time.  PSYCHIATRIC: The patient intubated and sedated and not able to fully assess.   LABORATORY DATA: Sodium 142, potassium 3.8, chloride 117, CO2 24, BUN 27, creatinine 0.8, glucose 210, total protein 5.5, albumin 2.8, total bilirubin 0.6, alkaline phosphatase 68, AST 25, ALT 8. Troponin less than 0.02. TSH 0.419. Urine drug screen was negative. CBC shows WBC 10.1, hemoglobin 10.8, hematocrit 31, platelets 143. Blood cultures x2 sets are negative. Urinalysis shows urine protein 100 mg/dL, less than 1 RBC per high-power field, 1 WBC  per high-power field. Salicylate level was 63.7 upon presentation and currently 66.5. Third salicylate level is currently pending. Initial ABG showed pH of 7.19, pCO2 36,  pO2 434. Current ABG shows a pH of 7.34, pCO2 30, pO2 of 121, FiO2 50%. CT head showed no acute abnormality.   IMPRESSION: This is a 54 year old Caucasian female with past medical history of obsessive-compulsive disorder, depression, hyperlipidemia, anxiety, tobacco abuse, who presented to [facility] with altered mental status and salicylate toxicity.   1. Salicylate toxicity.  2. Metabolic acidosis, improving.  3. Respiratory failure.  4. Hypotension.  5. Depression with suicide attempt.   PLAN: The patient presented with salicylate toxicity. Her initial salicylate level was found to be 63.7. Most recent salicylate level was 66.5. Third salicylate level is pending. The absolute indication for dialysis is salicylate level of 100 mg/dL. We are currently awaiting the third level. If the third level was found to be trending down, we will continue supportive care with IV fluid hydration including bicarbonate administration. If this third level is found to be rising, we will consider dialysis, and we have discussed this in depth with the patient's husband. There has been some improvement in her parameters noted. The pH was up to 7.34 with bicarbonate drip. We also recommend continued treatment of her hypotension. We would recommend starting pressors to maintain a MAP of 65 if the MAP were to fall less than 65. Continue supportive care with mechanical ventilation at this point in time. Case was discussed in depth with Drs. Kasa and Ahmadzia. Further plan as the patient progresses.   I would like to thank Dr. Rudene Rearwish for the kind referral.   ____________________________ Lennox PippinsMunsoor N. Deckard Stuber, MD mnl:OSi D: 05/01/2013 09:11:35 ET T: 05/01/2013 09:29:47 ET JOB#: [id number]  cc: Lennox PippinsMunsoor N. Paras Kreider, MD, <Dictator> Ria CommentMUNSOOR N Estelle Skibicki MD ELECTRONICALLY SIGNED 06/07/2013 10:21

## 2015-01-03 NOTE — H&P (Signed)
PATIENT NAME:  Dierdre SearlesOVERMAN, Patrycja T MR#:  [id number] DATE OF BIRTH:  12-15-1960  DATE OF ADMISSION:  05/01/2013  PRIMARY CARE PHYSICIAN: Unknown.   PSYCHIATRIST:  Mordecai RasmussenJohn Clapacs, MD   REFERRING PHYSICIAN: Daryel NovemberJonathan Williams, MD  CHIEF COMPLAINT: Drug overdose with salicylate in a suicide attempt, apparently.  HISTORY OF PRESENT ILLNESS: Mrs. Elwyn ReachOverman is a 54 year old Caucasian female with history of depression, primarily bipolar, and history of psychotic depression as well. Last admission to the psych unit was in May of this year with diagnosis of major depression with some psychotic features with obsessive-compulsive traits. The patient was brought to the hospital via EMS with drug overdose. EMS reported bottle was empty of benzodiazepine; however, work-up here was consistent with salicylate poisoning. The patient was hypotensive where systolic blood pressure was 80 at time of arrival. She was tachypneic, lethargic, and she was intubated and connected to the ventilator. Her blood work-up revealed severe metabolic acidosis. The hospitalist was consulted to admit the patient for further management of the above findings.   REVIEW OF SYSTEMS:  A 10 point system review is unobtainable due to the patient being intubated and connected to the ventilator.   SOCIAL HABITS: Unobtainable from the patient for similar reasons.   FAMILY HISTORY: Unobtainable because of the patient being intubated and on ventilator.   SOCIAL HISTORY: According to the records, she is married and her husband works for long Chartered certified accountantdistance construction type of work and frequently he is out of town for extended period of times. The patient has her mother who lives close by and a daughter who lives with her.   CURRENT MEDICATIONS:  According to the last note from May of this year, from Dr. Toni Amendlapacs, she is on Zyprexa 20 mg at bedtime, mirtazapine 45 mg at bedtime, trazodone 100 mg at bedtime, and he mentioned Remeron 3 times a day, but probably he  meant clonazepam 0.5 mg twice a day.   ALLERGIES: No known drug allergies.   PAST MEDICAL HISTORY: Again, obsessive-compulsive disorder, depression, hypercholesterolemia and anxiety.   PHYSICAL EXAMINATION: VITAL SIGNS: Blood pressure 110/50, respiratory rate 30, pulse 89, temperature 97.8. Oxygen saturation 99%.  GENERAL APPEARANCE: This is a middle-aged female lying in bed, diaphoretic, connected to ventilator and unresponsive. She is sedated.  HEAD AND NECK: No pallor. No icterus. No cyanosis. Neck is supple. Trachea at midline. No thyromegaly. No cervical lymphadenopathy. No masses. EARS: Could not assess hearing since the patient is unresponsive. No visible lesions. No discharge.  NOSE: No bleeding. No discharge. No ulcers.  OROPHARYNX: Was limited exam due to the patient being unresponsive and difficult to inspect, but the lips appear to be normal. Visible part of the tongue appears normal.  EYES: Normal eyelids and conjunctivae. Right pupil is slightly constricted. The left pupil is dilated about 6 mm. It does not react to light.  HEART: Normal S1, S2. No S3, S4. No murmur. No gallop. No carotid bruits.  LUNGS: She is tachypneic. A little wheezing heard. A few rhonchi. Could not hear any rales.  ABDOMEN: Obese and soft without tenderness. No rebound. No rigidity. No hepatosplenomegaly. No masses. No hernias.  SKIN: No ulcers. No subcutaneous nodules.  MUSCULOSKELETAL: No joint swelling. No clubbing.  NEUROLOGIC: The patient is intubated, sedated, does not respond to command. here is no facial asymmetry. She is on intravenous propofol and she is not moving any of her extremities.  PSYCHIATRIC: Can not be achieved since the patient is intubated and sedated.   LABORATORY AND DIAGNOSTICS:  CAT scan of the head without contrast showed no acute intracranial process. Chest X ray showed right lower lobe infiltrates.  Her EKG showed normal sinus rhythm at rate of 86 per minute. Unremarkable  EKG.  Her blood workup showed serum glucose 117, BUN 19, creatinine 0.6, sodium 144, potassium 3.3, bicarbonate was 12. Her anion gap was low at 6. Calcium 6.3. Her albumin was 2.8, AST 25, ALT 8. Troponin less than 0.02. TSH was less than 0.4. Drug screen was negative. Urine pregnancy test was negative. CBC showed white count of 10,000, hemoglobin 10.8, hematocrit 31, platelet count 143. Urinalysis was unremarkable with pH of 5. Acetaminophen level 3. Salicylate serum was 63.   Arterial blood gas showed a pH of 7.19, pCO2 36 and pO2 was 434. Follow up ABG after receiving bicarbonate and ventilation, her pH 7.22, pCO2 35 and pO2 143.   ASSESSMENT: 1.  Drug overdose in suicidal attempt, apparently. 2.  Salicylate poisoning.  3.  Severe metabolic acidosis.  4.  Aspiration pneumonia 5.  Hypocalcemia.  6.  Hypokalemia.  7.  History of major depression and obsessive-compulsive trait.   PLAN: The patient will be admitted to the intensive care unit for close monitoring. IV hydration started. IV bicarbonate drip and follow up on urine pH. Repeat serum salicylate level. IV antibiotic for aspiration pneumonia. The Emergency Department physician had contacted Dr. Cherylann Ratel, the nephrologist, and he did not feel that the patient needs urgent dialysis, but he will re-evaluate in the morning. Calcium supplementation to correct the hypocalcemia. Potassium supplementation. Monitor urine output. Foley catheter for accurate assessment of urine output. Psychiatric consult with Dr. Toni Amend since he is familiar with her condition. Neurology consult regarding unilateral pupil dilation. I do not have an explanation for that. Another possibility that she might have recent eye examination and atropine administration, but I do not have confirmation for that. For deep vein thrombosis prophylaxis, I will avoid anticoagulation given the patient has overdose with salicylate, which will be also potential ulcer effect for the digestive  system and potential bleed. Additionally, her prothrombin time is slightly elevated and her platelet count is below the normal level. Therefore, I will use only TED stockings and compression.   TIME SPENT: In evaluating this patient was more than 1 hour and 30 minutes. Part of it is the discovery of unilateral pupil dilatation necessitating further investigation with CAT scan of the head which came back to be normal eventually. Discussion with the Emergency Department physician. Finally, time spent in intensive care unit management was more than 30 minutes.  ____________________________ Carney Corners. Rudene Re, MD amd:sb D: 05/01/2013 01:31:04 ET T: 05/01/2013 07:45:44 ET JOB#: [id number]  cc: Carney Corners. Rudene Re, MD, <Dictator> Karolee Ohs Dala Dock MD ELECTRONICALLY SIGNED 05/02/2013 1:50

## 2015-01-03 NOTE — Consult Note (Signed)
PATIENT NAME:  Shirley Shirley [name], Shirley Shirley [name] MR#:  [id number] DATE OF BIRTH:  05-14-1961  DATE OF CONSULTATION:  05/01/2013  REFERRING PHYSICIAN:  Carney CornersAmir M. Rudene Rearwish, MD CONSULTING PHYSICIAN:  Mizani Dilday K. Sherryll BurgerShah, MD  REASON FOR CONSULTATION: Left pupillary dilation in a patient with suicidal attempt with salicylates.   HISTORY OF PRESENT ILLNESS: Ms. Shirley ReachOverman is a 54 year old Caucasian female patient of Dr. Toni Amendlapacs who has very severe depression, getting electroconvulsive therapy with medication management, who was feeling more depressed and was making some negative remarks.    Apparently, the patient was found to be hypotensive, unresponsive in ER, with empty bottle when was brought by EMS.   She was tachypneic, lethargic and was intubated due to acidosis. Found to have salicylate poisoning and has been admitted to the ICU. Nephrology has been following as well.   Neurology was consulted due to the patient was noted to have left pupillary dilation compared to the right, which was reactive.   We do not know exactly what medications she received beforehand.   PAST MEDICAL HISTORY: Significant for depression, obsessive-compulsive disorder, hyperlipidemia, anxiety and tobacco abuse.   PAST SURGICAL HISTORY: Negative.   FAMILY HISTORY: Not obtainable, but per record review, she also has family history of depression.   REVIEW OF SYSTEMS: Unobtainable as the patient was intubated and sedated.   PHYSICAL EXAMINATION:  VITAL SIGNS: Her temperature was 98.9, pulse 68, respiratory rate 17, blood pressure 91/44, pulse oximetry 93%.  GENERAL: She is a middle-aged Caucasian female lying in bed, surrounded by family members, intubated, has Foley catheter in.  NECK: The patient has a supple neck.  NEUROLOGIC: Cranial nerves: The  patient's left pupil is larger than the right, around 6 mm to 3 mm versus on the right 4 mm to 2 mm.  She might have afferent pupillary defect on the left, but it was not very clear.  The  patient does have intact vestibulo-ocular reflex.  I did not see any other signs of third nerve palsy on the left, but the exam was limited due to her comatose state.  Her face seems to be symmetric. I cannot check for her hearing or other cranial nerves.  On her painful stimulation of her extremities, she seems to withdraw on all the extremities. She does have a generalized hypotonia. I cannot check her sensation, cerebellar exam or other detailed neurological exam. Her reflexes seem to be symmetric.  LUNGS: Clear to auscultation.  HEART: S1, S2 heart sounds. Carotid exam did not reveal any bruit.  GENITOURINARY: The patient seems to be making good amount of urine.   REVIEW OF RADIOLOGICAL DATA: Her CT scan of the head was unremarkable.   ASSESSMENT AND PLAN:  1. Unilateral pupillary dilation on the left side, which is quite well-reactive to light in a patient with recent episode of suicidal attempt with salicylate poisoning, with significant metabolic acidosis, on ventilator.   Likely this may be medication effect-related as anticholinergic medication can give her pupillary dilation. The unusual part is that she has asymmetric pupillary dilation.   The patient does not have a known history of physiological anisocoria, per family.   The patient does not have signs or symptom of demyelinating disease. Per family's description, no Uhthoff's phenomenon or Lhermitte sign.   The patient does not have a previous history suggestive of aneurysm of headache. Her CT scan of the head was not suggestive of subarachnoid hemorrhage or aneurysm rupture.   Ideally, I would like to obtain CT angiogram of her  intracranial with circle of Willis to make sure she does not have aneurysm of the posterior inferior communicating artery or other blood vessel which can be causing compression on the outer aspect of the third nerve, causing pupillary dilation, but I have low suspicion for this. First, I will have to check  with nephrology and make sure that it is safe so we do not damage her kidneys further.   Other option would be to obtain the MRI of the brain with and without contrast with the MRA, but the patient is intubated, and in our facility, it is not possible to do the MRI with the ventilator.   I do not think there is an urgent need at present to do MRI at this moment.   The patient is not showing any other signs of increased intracranial pressure at present.   2. Depression and suicidal attempt, per psychiatry.   3. Metabolic acidosis. The patient is getting bicarbonate drip. Currently, nephrology does not think that the patient needs hemodialysis at present, but they would continue to follow.   The patient is also followed by intensivist, Dr. Belia Heman, for her vent management.   Feel free to contact me with any further questions. I will follow this patient with you.   ____________________________ Dontavious Emily K. Sherryll Burger, MD hks:OSi D: 05/02/2013 09:01:00 ET Shirley [name]: 05/02/2013 09:35:38 ET JOB#: [id number]  cc: Mattison Golay K. Sherryll Burger, MD, <Dictator> Durene Cal [facility] MD ELECTRONICALLY SIGNED 05/08/2013 16:25

## 2015-01-03 NOTE — Consult Note (Signed)
Brief Consult Note: Diagnosis: major depression.   Patient was seen by consultant.   Consult note dictated.   Recommend further assessment or treatment.   Comments: Psychiatry: PAtient seen.Chart reviewed. I also spoke with husband and mother of patient. Looking at all the evidence I would agree with the assumption that thhis was a suicide attempt. In her current state it is not important to try to replicate her psych meds. Once she is extubated and able to take po I'm sure we will restart meds and I [facility] Will follow.  Electronic Signatures: Audery Amellapacs, John T (MD)  (Signed 19-Aug-14 12:42)  Authored: Brief Consult Note   Last Updated: 19-Aug-14 12:42 by Audery Amellapacs, John T (MD)

## 2015-01-03 NOTE — H&P (Signed)
PATIENT NAME:  ANAHLA, BEVIS MR#:  [id number] DATE OF BIRTH:  1961-04-13  DATE OF EVALUATION:  05/09/2013  IDENTIFYING INFORMATION AND CHIEF COMPLAINT: A 54 year old woman admitted in transfer from the medical service.   CHIEF COMPLAINT: "I don't remember it."   HISTORY OF PRESENT ILLNESS: Information obtained from the patient and the chart. The patient was admitted to the medical service on August 19 after having taken an overdose that  probably mostly consisted of aspirin. She was admitted to the critical care unit and required mechanical ventilation for several days due to metabolic acidosis. Eventually she was able to come off the ventilator and has now become medically stable enough to come to the psychiatry ward.   The patient says that she does not remember taking an overdose of BC powders. A very large number of empty BC powders were found at her home. There does not seem to be clear evidence that she overdosed on anything else. The husband professed being surprised that this but the patient's mother said that she was not surprised and felt like the patient had been on the brink of a breakdown for several days. The patient had been going through an extended period of depression and anxiety. Her nerves felt on edge all the time. Her thoughts were slow and anxious. She felt easily overwhelmed. She denies to me that she was aware of having suicidal ideation. Denies having any psychotic symptoms. She had been compliant with her psychiatric medicine and was also getting intermittent ECT as well.   PAST PSYCHIATRIC HISTORY: The patient has had a couple of prior hospitalizations here for major depression, recurrent and severe. Also has chronic severe anxiety along with her depression. She had been stabilized on a combination of Prozac and Zyprexa for a while. After a decompensation on that, she had been treated with ECT which had only partial benefit. She denies having had previous actual suicide  attempts but had had suicidal ideation in the past. The patient had not been on other antidepressants or other psychiatric medicines recently. She has been gradually declining in her ability to work and function over at least the last several months.  SOCIAL HISTORY: The patient is married. She has a teenage daughter who lives at home as well. She is also close to her mother who lives nearby. The patient's husband works doing Holiday representative on jobs that frequently takes him out of town. 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   PAST MEDICAL HISTORY: The patient was in the CCU for several days. Had metabolic ketoacidosis. Also had hyponatremia. She appears to have recovered at this point. Really has no other significant ongoing medical problems except some dyslipidemia.   SUBSTANCE ABUSE HISTORY: No history of alcohol or drug abuse.    FAMILY HISTORY: Positive for depression.   CURRENT MEDICATIONS: On transfer from medical, she is taking melatonin 3 mg at night, olanzapine 10 mg at night, trazodone p.r.n. We had stopped the Prozac for a couple of days out of concern about her hyponatremia.   ALLERGIES: No known drug allergies.   REVIEW OF SYSTEMS: Currently she says that her mood is feeling pretty good. She denies feeling depressed. Denies feeling panicky or obsessive. Still feeling a little bit tired and unsteady and weak in her legs. No suicidal ideation. No homicidal ideation. Denies hallucinations.   MENTAL STATUS EXAMINATION: Slightly disheveled woman who looks her stated age. Cooperative with the interview. Eye contact is diminished. Psychomotor activity  slow and sluggish. Voice is quiet but easy to understand, decreased in total amount of speech. Affect blunted but not panicky. Mood stated as being alright. Thoughts again slow but not bizarre. No evidence of delusional thinking or loosening of associations. Denies auditory or visual  hallucinations. Denies suicidal or homicidal ideation. Shows improving judgment and insight. Normal intelligence. Alert and oriented x4.   PHYSICAL EXAMINATION:  GENERAL: The patient still appears a little unsteady on her feet. Had to hold onto the wall at times as we walked down the hall.  SKIN: Dry but no acute lesions.  HEENT: Pupils equal and reactive. Face symmetric. Oral mucosa normal. Strength and reflexes normal and symmetric throughout. Cranial nerves symmetric and normal.  EXTREMITIES: Full range of motion at all extremities.  LUNGS: Clear without wheezes.  HEART: Regular rate and rhythm.  ABDOMEN: Soft, nontender, normal bowel sounds.  VITAL SIGNS: Temperature 98.6, pulse 95, respirations 18, blood pressure 109/55.   LABORATORY RESULTS: As of today, her sodium is 133 which is an improvement. Potassium is still a little low at 3.3.   ASSESSMENT: A 54 year old woman with recurrent severe major depression who made a serious suicide attempt, although she claims not to remember it. She has recovered medically and is now reporting that her symptoms are largely in remission. She is asking not to return to getting ECT treatment. I think there is some sense in which she may be minimizing her ongoing problems.   I do not think that we need to force the issue of the ECT which was only partially effective. I will not put her back on the ECT schedule. I suggested to her that we make some changes in her medicine at this point, given how she had been doing before. Rather than going back to the Prozac, we will start Zoloft 50 mg a day and stay off an antipsychotic for now and p.r.n. medicine for sleep. Engage her in groups and individual therapy. Try and contact the husband. See if we can get her to be stable enough that we can work on discharge planning.   DIAGNOSIS PRIMARY:  AXIS I: Major depression, severe, recurrent.   SECONDARY DIAGNOSES:  AXIS I: Deferred.  AXIS II: Deferred.  AXIS III:  Dyslipidemia, hyponatremia, recent aspirin overdose.  AXIS IV: Moderate to severe from burden of illness, financial concerns.  AXIS V: Functioning at time of evaluation is 40.    ____________________________ Audery AmelJohn T. Maybelle Depaoli, MD jtc:np D: 05/09/2013 15:38:32 ET T: 05/09/2013 16:30:53 ET JOB#: [id number]  cc: Audery AmelJohn T. Iver Miklas, MD, <Dictator> Audery AmelJOHN T Kingsley Herandez MD ELECTRONICALLY SIGNED 05/10/2013 10:20

## 2015-01-04 NOTE — Consult Note (Signed)
PATIENT NAME:  Shirley [name], Shirley [name] MR#:  [id number] DATE OF BIRTH:  1961-07-18  DATE OF CONSULTATION:  05/28/2014   CONSULTING PHYSICIAN:  Pauletta BrownsYuriy Yudith Norlander, MD  REASON FOR CONSULTATION:  Abnormal MRI findings.  HISTORY OF PRESENT ILLNESS: This is a 54 year old female with a past medical history of recurrent depression, admitted to psychiatry unit for worsening depression.  The patient has a chronic history of depression with recurrent episodes that are progressive, that has been worsening over the past few months.  Her mood stays down all the time with an elevated level of anxiety.  The patient received an MRI of the brain that showed nonspecific T2 white matter changes.   PAST MEDICAL HISTORY: History of chronic depression.   FAMILY HISTORY: Extensive history of anxiety, history of migraines.   SOCIAL HISTORY: She is married, denies EtOH or drug use.   HOME MEDICATIONS:  Prior to admission includes Lexapro, trazodone, melatonin and pantoprazole.   ALLERGIES: None known drug allergies.   IMAGING:  As described above. MRI multiple, T2 white matter changes, with no acute ischemia.   PHYSICAL EXAMINATION:  VITAL SIGNS: Include a temperature of 98.8, pulse 112, respirations 20, blood pressure 116/88. The patient appears to be anxious.  NEUROLOGIC: The patient is alert, awake, oriented to time, place, location. Extraocular movements are intact. Pupils are round, and reactive bilaterally.  Facial sensation intact.  Facial motor is intact.  Speech is fluent.  Motor strength appears to be 5/5 bilateral upper and lower extremities.  Reflexes 2+ throughout.  Coordination: Finger-to-nose intact.  Gait: The patient was able to ambulate without assistance.   IMPRESSION:  A 54 year old female with long history of depression, worsening over the past couple of months.  On admission, the patient is status post MRI of the brain.  MRI of the brain showed, T2 white matter changes.  The patient has no history of  multiple sclerosis.  No weakness on 1 side of the body compared to the other. No family history of multiple sclerosis.  I do not think this is a vasculitis.  Vasculitis, presents with multiple strokes in different ages in different territories.  I think this is an incidental finding of T2 hyperintensity, specifically in a patient that is probably less mobile with increased areas of atrophy.  I believe it is incidental finding which I do not suspect is contributing to her current depression symptoms.  I would not recommend any further imaging at this point.  We will continue current psychiatric treatment.  I believe her current trauma that she is complaining is probably anxiety provoked.   Thank you, it was a pleasure seeing this patient. Please call with any questions.     ____________________________ Pauletta BrownsYuriy Amariya Liskey, MD yz:DT D: 05/28/2014 15:52:50 ET [name]: 05/28/2014 16:26:12 ET JOB#: [id number]  cc: Pauletta BrownsYuriy Eulene Pekar, MD, <Dictator> Pauletta BrownsYURIY Amayra Kiedrowski MD ELECTRONICALLY SIGNED 06/17/2014 14:24

## 2015-01-04 NOTE — Consult Note (Signed)
Brief Consult Note: Diagnosis: major depression.   Patient was seen by consultant.   Consult note dictated.   Recommend further assessment or treatment.   Orders entered.   Discussed with Attending MD.   Comments: Psychiatry: PAtient seen in outpt clinic. Depression and anxiety progressively worse. Having suicidal thoughts. Hx past suicidae attempts. Not eating or drinking or taking care of self. Admit for safety and stabilization.  Electronic Signatures: Clapacs, Jackquline DenmarkJohn T (MD)  (Signed 10-Sep-15 15:37)  Authored: Brief Consult Note   Last Updated: 10-Sep-15 15:37 by Audery Amellapacs, John T (MD)

## 2015-01-04 NOTE — Consult Note (Signed)
PATIENT NAME:  Shirley [name], Shirley T MR#:  [id number] DATE OF BIRTH:  [birth year]  DATE OF CONSULTATION:  05/23/2014  REFERRING PHYSICIAN:   CONSULTING PHYSICIAN:  Audery AmelJohn T. Jayanna Kroeger, MD  IDENTIFYING INFORMATION AND REASON FOR CONSULTATION: A 54 year old woman with a history of recurrent depression who is being directly admitted to the hospital from my office. Consultation performed in the service of admitting her to Dr. Hardie ShackletonHernandez's service.   HISTORY OF PRESENT ILLNESS: The patient has a history of recurrent severe depression which has been getting progressively worse over the last few months. Her mood stays down all the time. Her anxiety level is subjectively enormously high. She feels worried all the time. Has negative thoughts running through her head constantly. Finds herself worrying over trivial matters that she cannot stop thinking about. Meanwhile her energy level has plummeted. She is barely able to get out of a chair or take care of herself. She has been eating poorly and drinking poorly to the point that she is probably getting dehydrated and orthostatic. She reports she started having some thoughts about wishing that she were dead. Has not had a specific plan. Does not have specific psychotic symptoms at this point. The patient had been seeing me for outpatient treatment for a while now. I saw her a couple of weeks ago and had tried increasing her Lexapro from 20 mg up to 40 mg a day in the hope that we could treat some of her OCD symptoms. This has not shown any benefit, although I am not sure if she is having any side effects either. The patient's husband is going out of town to work this upcoming week. There is concern about whether the patient will be able to take care of herself safely.   PAST PSYCHIATRIC HISTORY: She has had a pretty long history over the last few years of recurrent episodes of depression. At times her symptoms are more like depression and at times more like severe anxiety. She  has been treated with multiple antidepressants. At one time in the past I had thought that she was stable on a combination of Prozac and Zyprexa, but she began to show breakthrough symptoms and became depressed and anxious even on that. Since then she has been tried on Zoloft, Remeron, Lexapro, Abilify and clonazepam all without benefit. Interestingly, for her complaints of severe anxiety, the clonazepam seems to do very little to blunt her bad feeling. She does have a history of a suicide attempt by overdose on aspirin products. Has had suicidal ideation in the past as well.   FAMILY HISTORY: Extensive problems with anxiety. Her daughter recently started having panic attacks, which has been speculated as a possible trigger for this.   SOCIAL HISTORY: Married. Seems to have a pretty good relationship with her husband. He does Holiday representativeconstruction work that often takes him out of town for extended periods of time. There is a 53 year old daughter who lives at home. The patient's relationship with her daughter has been complicated. Recently the daughter started having anxiety symptoms as well. Mother lives close by and also presents a somewhat complicated and mixed relationship. 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   PAST MEDICAL HISTORY: Relatively well. Not taking any other medications. She just had a colonoscopy and endoscopy recently, but there is no new problem. Recently started taking Protonix.   CURRENT MEDICATIONS: Has been taking Lexapro 40 mg twice a day, clonazepam  1 tablet 3 times a day, trazodone 100 mg at night, Abilify 15 mg at night, melatonin 5 mg at night and pantoprazole 40 mg twice a day.   ALLERGIES: No known drug allergies.   MENTAL STATUS EXAMINATION: Neatly groomed, clean woman who looks her stated age. Eye contact minimal. Psychomotor activity very sluggish. She says that she feels like she is shaking all the time,  but she does not really have a visible tremor at rest. Speech is quiet and slow. Affect flat. Mood is described as being nervous and depressed. Thoughts ruminate on negative and anxious concerns. Denies hallucinations. Has had some passive thoughts about wishing she were dead. No plan to kill herself. No homicidal ideation. Can recall 3 out of 3 objects immediately, only 1 out of 3 at three minutes. Alert and oriented x4. Judgment and insight adequate.   LABORATORY RESULTS: Nothing back yet. I have ordered labs.  ASSESSMENT: This is a 54 year old woman with long-standing symptoms of severe depression and anxiety. She has not responded to ECT in the past. She has not responded to several medication trials. Despite having severe symptoms of obsessive compulsive disorder, she is not showing improved symptoms of her illness with serotonin reuptake inhibitors. Benzodiazepine seemed to have little effect as well. She has a positive past history of suicide attempts and has started having some hopeless, suicidal-like thoughts as well as not eating or drinking. I think she needs to be hospitalized for safety and evaluation.   TREATMENT PLAN: Admit to Dr. Ardyth Harps. For now I am continuing her current medications, but have discussed the case with Dr. Ardyth Harps who is welcome to make changes to the medication. I have ordered a MRI on the off chance that there may be some multiple sclerosis or degenerative condition that could be causing some of this. Precautions in place.   DIAGNOSIS, PRINCIPAL AND PRIMARY:  AXIS I: Major depression, severe, recurrent.   SECONDARY DIAGNOSES: AXIS I: Anxiety disorder, not otherwise specified.  AXIS II: Deferred.  AXIS III: Gastric reflux symptoms.  ____________________________ Audery Amel, MD jtc:sb D: 05/23/2014 15:46:24 ET T: 05/23/2014 16:29:02 ET JOB#: [id number]  cc: Audery Amel, MD, <Dictator> Audery Amel MD ELECTRONICALLY SIGNED 05/25/2014 22:25

## 2015-01-05 NOTE — H&P (Signed)
PATIENT NAME:  Shirley [name], Shirley [name] MR#:  [id number] DATE OF BIRTH:  05-22-1961  DATE OF ADMISSION:  12/31/2011  IDENTIFYING INFORMATION AND CHIEF COMPLAINT: A 54 year old woman with a history of severe, recurrent major depression who is admitted to the hospital directly because of worsening depression with suicidal ideation.   CHIEF COMPLAINT: "I just feel like I am not in control."   HISTORY OF PRESENT ILLNESS: The patient telephoned my office today to report that her mood was getting worse. She was extremely anxious, felt like she could do nothing but pace, felt like her thoughts were racing and was starting to think that she was out of control and might "do something stupid." She denies having any specific suicidal ideation but was feeling paranoid and frightened. Her family also was very concerned. The patient's symptoms have been worsening over the past few weeks since we tapered and discontinued. Zyprexa. Current stresses include her ongoing worry about being the main caretaker of her household. She works full time, and her husband is out of town a lot. She also worries about other members of her family, perhaps needlessly. She has recently been not sleeping as well. Appetite has been normal. Denies hallucinations. Denies any delusions. Denies having attempted suicide. Not abusing drugs or alcohol.   PAST PSYCHIATRIC HISTORY: Has had several episodes of severe recurrent depression. Last episode required hospitalization last September. She was stabilized on a combination of Prozac and Zyprexa and had subsequently been followed in my outpatient clinic. Recently her symptoms had been stable and much improved for many months, and she had gained quite a bit weight. We discussed the risks and elected to taper the Zyprexa. Consequently she seems to have developed a great worsening of her symptoms with the above-reported problems. Does have a past history of possible overdoses.   SUBSTANCE ABUSE HISTORY:  Patient does not drink, does not abuse other substances, no acute substance abuse either.   PAST MEDICAL HISTORY: No significant ongoing medical problems.   SOCIAL HISTORY: The patient works as a rural Catering manager. She finds her job rather stressful although when her symptoms are under control it is not a great burden. Her husband is employed doing labor that often requires him to be out of town for long stretches at a time. The patient will often get very upset during these times because she feels like she is left to take care of the whole household although overall she has a good relationship with her husband. She has a close relationship with her mother although when she is sick she gets particularly upset worrying about her mother's health.   FAMILY HISTORY: Does have a family history of suicide in several members of the family with severe depression.   CURRENT MEDICATIONS:  1. Prozac 40 mg a day.   2. Trazodone 100 mg at night.   3. Had restarted Zyprexa but was only at this point taking 5 mg at night and only for the last few days.  4. Klonopin 0.5 mg twice a day.   ALLERGIES: No known drug allergies.   REVIEW OF SYSTEMS: Depressed mood. Anxiety. Feeling jittery. Feeling like pacing. Feeling like her mind is racing and out of control. Passive suicidal thoughts. Denies hallucinations. Denies delusions. Not sleeping so well. No other new physical symptoms.   MENTAL STATUS EXAMINATION:  Cooperative with the exam. Good eye contact. Normal psychomotor activity, perhaps a little bit fidgety. Speech normal rate, tone, and volume. Affect is anxious. Mood stated as nervous and  depressed. Thoughts are generally lucid with no obvious loosening of associations or delusional thinking. Denies suicidal or homicidal intent but has had thoughts about wishing she would not wake up. Denies hallucinations. Adequate judgment and insight.   PHYSICAL EXAMINATION:  VITAL SIGNS: Current weight is 164 pounds.  She is 65 inches tall. Temperature 99.2, pulse 71, respirations 18, blood pressure 106/65.   SKIN: No acute skin lesions identified.   HEENT: Pupils equal and reactive. Face symmetric. Oral mucosa normal.   NECK: Supple.   BACK: Nontender.   MUSCULOSKELETAL: Full range of motion at all extremities. Normal gait.   LUNGS: Clear to auscultation with no extra sounds.   HEART: Regular rate and rhythm. No extra sounds.   ABDOMEN: Soft, nontender, normal bowel sounds.   ASSESSMENT: A 54 year old woman with recurrent severe depression with psychotic features, increased anxiety and depression with suicidal fears, serious past history of suicidal ideation and out-of-control behavior when depressed. Not responding to immediate outpatient treatment that we had been trying in the clinic. Had continued to decline this week. Admission to the hospital indicated for and safety and stabilization.   TREATMENT PLAN: Continue all medications. Restart Zyprexa at 10 mg at night but I am going to give her 15 mg to start with tonight. Including groups and activities with supportive therapy daily. Social Work to be involved in making sure that social needs are being met, possible family meeting. Monitor mood and thinking and suicidal ideation.   DIAGNOSIS PRINCIPLE AND PRIMARY:  AXIS I: Major depressive episode, severe, recurrent, with psychotic features.   SECONDARY DIAGNOSES:  AXIS I: No further.  AXIS II: No diagnosis.  AXIS III: No diagnosis.  AXIS IV: Severe from burden of illness and social isolation.  AXIS V: Functioning at time of evaluation 30.     ____________________________ Gonzella Lex, MD jtc:vtd D: 12/31/2011 18:40:40 ET  T: 01/01/2012 06:19:46 ET  JOB#: [id number] cc: Gonzella Lex, MD, <Dictator> Gonzella Lex MD ELECTRONICALLY SIGNED 01/03/2012 9:42

## 2015-01-05 NOTE — Discharge Summary (Signed)
PATIENT NAME:  Shirley [name], Shirley [name] MR#:  [id number] DATE OF BIRTH:  [birth year]  DATE OF ADMISSION:  12/31/2011 DATE OF DISCHARGE:  01/12/2012  HOSPITAL COURSE: See dictated history and physical for details of admission. This 54 year old woman came into the hospital voluntarily because of worsening depression and anxiety with suicidal ideation. She has a history of recurrent major depression with psychotic-like symptoms. She had recently been tapered down and off of her Zyprexa because she had been so stable. Symptoms recurred with a worsening of obsessive anxiety and depression. In the hospital the patient was reporting initially very depressed mood and very large levels of anxiety. She would have obsessive worry about whether she was going to harm herself. Did not actually seem to want to die but just got very anxious about it. Had a hard time engaging in groups because of her social anxiety. She was able to sleep fairly well most nights. Did not report hallucinations. Seemed to be doing her best to be cooperative with treatment. Over the first few days she was here, she reported that her symptoms seemed to be getting worse and she was switched off of her Prozac onto Zoloft. Over the next week she showed no improvement. Finally after a week of failure to improve, she was switched back to Prozac 40 mg a day and has continued on Zyprexa now at a dose of 15 mg at night. Whether or not it was specific to the Prozac, she has shown quite a bit of improvement the last few days. Another factor that may be at work is that her husband who had been out-of-town for her entire hospitalization on a protracted work trip just came back into town and visited last night. In any case, the last couple of days she has shown improvement and today is denying any suicidal thought at all. Anxiety level is reported to be much decreased. She feels calmer and feels confident about discharge and is tolerating medicine well.   DISPOSITION:  She is to be discharged home to her family and has follow-up arrangements to see me in the office within the next week.   LABORATORY DATA: Labs obtained during this hospital stay included a check of the lipid profile which showed cholesterol level high at 252, triglycerides 91, HDL 50, VLDL 18, LDL 184.   DISCHARGE MEDICATIONS:  1. Fluoxetine 40 mg a day. 2. Clonazepam 0.5 mg 3 times a day. 3. Trazodone 100 mg at night. 4. Olanzapine 15 mg at night.   MENTAL STATUS EXAMINATION: Neatly dressed and groomed. Good eye contact. Normal psychomotor activity. Speech is quiet, decreased in total amount. Affect is a little bit flat and blunted. Mood stated as being better. Thoughts are lucid and directed with no evidence of loosening of associations or delusional thinking. Denies suicidal or homicidal ideation. Denies hallucinations. Shows improved judgment and insight and is agreeable to the discharge plan.   DIAGNOSES PRINCIPLE AND PRIMARY:  AXIS I: Major depressive episode, severe, with psychotic features, recurrent.   SECONDARY DIAGNOSES:  AXIS I: Rule out OCD.   AXIS II: Deferred.   AXIS III: No diagnosis.   AXIS IV: Moderate to severe chronic stress from taking care of her home, worrying about her job and her finances.   AXIS V: Functioning at time of discharge 61.    ____________________________ [name]. Clapacs, MD jtc:drc D: 01/12/2012 17:49:42 ET [name]: 01/14/2012 09:31:58 ET JOB#: [id number]  cc: [name]. Clapacs, MD, <Dictator> [name] CLAPACS MD ELECTRONICALLY SIGNED 01/14/2012 12:11

## 2015-01-08 DIAGNOSIS — F332 Major depressive disorder, recurrent severe without psychotic features: Secondary | ICD-10-CM | POA: Insufficient documentation

## 2015-01-08 DIAGNOSIS — F4001 Agoraphobia with panic disorder: Secondary | ICD-10-CM | POA: Insufficient documentation

## 2015-01-08 DIAGNOSIS — F428 Other obsessive-compulsive disorder: Secondary | ICD-10-CM | POA: Insufficient documentation

## 2015-01-21 NOTE — H&P (Signed)
PATIENT NAME:  Shirley [name], Shirley [name] [id number] OF BIRTH:  31-Aug-1961 OF ADMISSION:  05/23/2014 INFORMATION: A 54 year old woman with a history of recurrent depression who was admitted by Dr. Weber Cooks who is her outpatient psychiatrist.  severe depression OF PRESENT ILLNESS: The patient has a history of recurrent severe depression which has been getting progressively worse over the last few months.  Patient reports a 20 lbs weight lost over the last 3 months due to anorexia.  She c/o "shaking" all the time and feeling anxious, she worries she is going to die as she is not eating.  Her energy and libido are low.  Patient has been sleeping 10-12h per day.  Mood is depressed and hopeless. Has negative thoughts running through her head constantly. Finds herself worrying over trivial matters that she cannot stop thinking about. She reports she started having some thoughts about wishing that she were dead.  Denies h/o hallucinations. The patient had been seeing me for outpatient treatment for a while now. Dr. Weber Cooks saw her a couple of weeks ago and had tried increasing her Lexapro from 20 mg up to 40 mg a day, but this change didn?t help.  He decide to admit the patient as her husband is going out of town to work this upcoming week. There is concern about whether the patient will be able to take care of herself safely. h/o trauma.h/o substance abuse. PSYCHIATRIC HISTORY: She has a long history over the last few years of recurrent episodes of depression. She has failed multiple medications trials. A combination of Prozac and Zyprexa; Zoloft, Remeron, Lexapro, Abilify and clonazepam all without benefit. She does have a history of a suicide attempt by overdose on aspirin products. Patient had ECT in 2014 but failed to improve.  Not interested on ECT again. MEDICAL HISTORY: Relatively well. Not taking any other medications. She just had a colonoscopy and endoscopy recently, but there is no new problem. Recently started taking  Protonix. Denies h/o brain injury HISTORY: Extensive problems with anxiety. Her daughter recently started having panic attacks.  Brother abused alcohol.  There have been suicides in some relatives. HISTORY: Married. Seems to have a pretty good relationship with her husband. He does Architect work that often takes him out of town for extended periods of time. There is a 72 year old daughter who lives at home. The patient's relationship with her daughter has been complicated. Recently the daughter started having anxiety symptoms as well. Mother lives close by and also presents a somewhat complicated and mixed relationship. 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   She was in the [employer] and her discharge was honorable. Education: 12 grade.  Legal: denies.  MEDICATIONS: Has been taking Lexapro 40 mg twice a day, clonazepam 1 tablet 3 times a day, trazodone 100 mg at night, Abilify 15 mg at night, melatonin 5 mg at night and pantoprazole 40 mg twice a day.  No known drug allergies.  STATUS EXAMINATION: Neatly groomed, clean woman who looks her stated age. Eye contact minimal. Psychomotor activity retarded. Speech is quiet and slow. Affect flat. Mood is described as being nervous and depressed. Thoughts ruminate on negative and anxious concerns. Denies hallucinations. Has had some passive thoughts about wishing she were dead. No plan to kill herself. No homicidal ideation. Can recall 3 out of 3 objects immediately, only 1 out of 3 at three minutes. Alert and oriented x4. Judgment and insight adequate.  REVIEW OF SYSTEMS:20 lbs weight loss, no fever, chills, and weakness. + fatigue.no visual loos, blurred vision, double vision or yellow sclerae.  Ears, nose and throat: no hearing loos, sneezing, congestion, runny nose or sore throat.no rash or itchingno chest pain, chest pressure or discomfort.  No palpitations or edema.no shortness of  breath, cough or sputum.no anorexia, nausea, vomiting or diarrhea. No abdominal pain or blood.no burning on urination.no muscle, back or joint pain/stiffness.no anemia, bleeding or bruising.no enlarged nodes. No h/o splenectomy.see history of present illness.no headache, dizziness, syncope, paralysis, ataxia, numbness or tingling in extremities.  No change in bowel or bladder control.no reports of sweating, cold or heat intolerance.  No polyuria or poly- dipsia.no history of asthma, hives, eczema or rhinitis.  **Vital Signs.:   11-Sep-15 07:34  .?Vital Signs Type: Routine  .?Temperature Temperature (F): 98  .?Celsius: 36.6  .?Pulse Pulse: 96  .?Respirations Respirations: 20  .?Systolic BP Systolic BP: 903  .?Diastolic BP (mmHg) Diastolic BP (mmHg): 78   PHYSICAL EXAMINATION: APPEARANCE: The patient is alert, oriented.  Patient is in no acute distress. Head is normocephalic.  Pupils are equal and reactive. Supple without lymphadenopathy. Regular rate and rhythm. normal breath sounds. No crackles or wheezes are heard. Soft, nontender, nondistended with good bowel sounds heard. Without cyanosis, clubbing or edema.  NEUROLOGICAL: Gross nonfocal.    Labs:  Lab Results: Thyroid:  10-Sep-15 16:27   Thyroid Stimulating Hormone 1.09 (0.45-4.50= International Unit) -----------------------patients have reference for TSH: - - - - - - - - - - first trimetser: 0.36 - 2.50 uIU/mL)  Hepatic:  10-Sep-15 16:27   Bilirubin, Total 0.4  Alkaline Phosphatase 87 (46-116New Reference Range  SGOT (AST) 19  Total Protein, Serum 7.1  Albumin, Serum 3.7  SGPT (ALT) 22 (14-63New Reference Range  Routine Chem:  10-Sep-15 16:27   Glucose, Serum 91  BUN 13  Creatinine (comp) 1.03  Sodium, Serum 140  Potassium, Serum 4.1  Chloride, Serum 104  Osmolality (calc) 279  Calcium (Total), Serum 8.9  eGFR (African American) >60  eGFR (Non-African American) >60 (eGFR values <78m/min/1.73 m2 may be an indication of  chronicdisease (CKD).eGFR is useful in patients with stable renal function.eGFR calculation will not be reliable in acutely ill patientsserum creatinine is changing rapidly. It is not useful in on dialysis. The eGFR calculation may not be applicablepatients at the low and high extremes of body sizes, pregnantand vegetarians.)  Anion Gap  6  CO2, Serum 30  Urine Drugs:  [id number]-[id number]  Tricyclic Antidepressant, Ur Qual (comp) NEGATIVE (Result(s) reported on 24 May 2014 at 03:03PM.)  Amphetamines, Urine Qual. NEGATIVE  MDMA, Urine Qual. NEGATIVE  Cocaine Metabolite, Urine Qual. NEGATIVE  Opiate, Urine qual NEGATIVE  Phencyclidine, Urine Qual. NEGATIVE  Cannabinoid, Urine Qual. NEGATIVE  Barbiturates, Urine Qual. NEGATIVE  Benzodiazepine, Urine Qual. NEGATIVE (-----------------URINE DRUG SCREEN provides only a preliminary, unconfirmedtest result and should not be used for non-medical  Clinical consideration and professional judgment should be to any positive drug screen result due to possiblesubstances.  A more specific alternate chemical methodbe used in order to obtain a confirmed analytical result.  Gasspectrometry (GC/MS) is the preferredmethod.)  Methadone, Urine Qual. NEGATIVE  Routine UA:  11-Sep-15 14:20   Color (UA) Yellow  Clarity (UA) Hazy  Glucose (UA) Negative  Bilirubin (UA) Negative  Ketones (UA) Trace  Specific Gravity (UA) 1.016  Blood (UA) Negative  pH (UA) 6.0  Protein (UA) Negative  Nitrite (UA) Negative  Leukocyte Esterase (UA) 1+ (Result(s) reported  on 24 May 2014 at 03:06PM.)  RBC (UA) <1 /HPF  WBC (UA) 4 /HPF  Bacteria (UA) NONE SEEN  Epithelial Cells (UA) 1 /HPF  Mucous (UA) PRESENT (Result(s) reported on 24 May 2014 at 03:06PM.)  Routine Hem:  10-Sep-15 16:27   WBC (CBC) 5.2  RBC (CBC) 4.45  Hemoglobin (CBC) 14.0  Hematocrit (CBC) 41.5  Platelet Count (CBC) 199  MCV 93  MCH 31.4  MCHC 33.6  RDW 13.0  Neutrophil % 67.0  Lymphocyte % 24.7  Monocyte  % 6.6  Eosinophil % 1.1  Basophil % 0.6  Neutrophil # 3.5  Lymphocyte # 1.3  Monocyte # 0.3  Eosinophil # 0.1  Basophil # 0.0 (Result(s) reported on 23 May 2014 at 04:52PM.)   PRINCIPAL AND PRIMARY: I: Major depression, severe, recurrent.  GADII: Dependent traits r/o disorderIII: Gastric reflux symptoms.  ASSESSMENT: This is a 54 year old woman with long-standing symptoms of severe refractory depression and anxiety. Admitted due to self-care deficits. She has not responded to ECT in the past. She has not responded to several medication trials.  start the patient on Effexor XR 37.5 mg po q am to target GAD and MDDstart lithium 450 mg po bid for antidepressant augmentationcontinue trazodone qhs but will decrease dose to 50 mg as pt states she has been sleeping for 10-12 hdecrease klonopin to 0.5 mg po tid in order to improve energy levelwas d/cwas d/cencourage pt to participate in groups and to consider reassuming individual therapy at d/c.    Electronic Signatures: Hildred Priest (MD) (Signed on 11-Sep-15 16:34)  Authored   Last Updated: 22-Sep-15 21:00 by Hildred Priest (MD)

## 2015-02-25 ENCOUNTER — Telehealth: Payer: Self-pay | Admitting: Psychiatry

## 2015-02-25 ENCOUNTER — Other Ambulatory Visit: Payer: Self-pay

## 2015-02-25 DIAGNOSIS — F4001 Agoraphobia with panic disorder: Secondary | ICD-10-CM

## 2015-02-25 MED ORDER — CLONAZEPAM 0.5 MG PO TABS: 0.5000 mg | ORAL_TABLET | Freq: Three times a day (TID) | ORAL | Status: DC

## 2015-02-25 NOTE — Telephone Encounter (Signed)
pt called she needed to r/s her appt from 03-11-15 to 04-07-15.  pt is going to [place] and she needs at least 6-8 week supply of her clonazepam .5mg  [facility]. pt is [place] go [place]

## 2015-03-11 ENCOUNTER — Ambulatory Visit: Payer: Self-pay | Admitting: Psychiatry

## 2015-04-07 ENCOUNTER — Ambulatory Visit: Payer: Self-pay | Admitting: Psychiatry

## 2015-04-14 ENCOUNTER — Ambulatory Visit: Payer: Self-pay | Admitting: Psychiatry

## 2015-07-03 ENCOUNTER — Telehealth: Payer: Self-pay

## 2015-07-03 NOTE — Telephone Encounter (Signed)
pt called states she is still in [place] with her granddaughter. pt states she is out o her medication. pt was last seen on  12-10-14 pt cancel 3 appt. ( 03-11-15, 04-07-15 , 04-14-15). Pt was advised that medication may not be approved.  pt was advised to go to ER or a walk in clinic in [place] until [place] can come [place].    Pt needs refill on all medications,  Do you want to refill or have pt go to er in [place]?

## 2015-07-08 NOTE — Telephone Encounter (Signed)
I definitely don't want her to stop her medicine. I would be happy to call it in her put in an IEP prescription to wherever she is in [place] if that'll take care of it. If she would rather go to an ER that's fine but probably makes more sense for me to just refill it. Could you please get her to leave a number and name of a pharmacy in [place] and will take care of it tomorrow. Thank you

## 2015-07-15 ENCOUNTER — Encounter: Payer: Self-pay | Admitting: Psychiatry

## 2015-07-15 ENCOUNTER — Ambulatory Visit (INDEPENDENT_AMBULATORY_CARE_PROVIDER_SITE_OTHER): Payer: Federal, State, Local not specified - PPO | Admitting: Psychiatry

## 2015-07-15 VITALS — BP 122/86 | HR 88 | Temp 98.4°F | Ht 65.0 in | Wt 215.6 lb

## 2015-07-15 DIAGNOSIS — J309 Allergic rhinitis, unspecified: Secondary | ICD-10-CM | POA: Insufficient documentation

## 2015-07-15 DIAGNOSIS — F332 Major depressive disorder, recurrent severe without psychotic features: Secondary | ICD-10-CM

## 2015-07-15 DIAGNOSIS — F4001 Agoraphobia with panic disorder: Secondary | ICD-10-CM | POA: Diagnosis not present

## 2015-07-15 MED ORDER — PROMETHAZINE HCL 25 MG PO TABS: 25.0000 mg | ORAL_TABLET | ORAL | Status: DC | PRN

## 2015-07-15 MED ORDER — CLONAZEPAM 0.5 MG PO TABS: 0.5000 mg | ORAL_TABLET | Freq: Three times a day (TID) | ORAL | Status: DC

## 2015-07-15 MED ORDER — TRAZODONE HCL 100 MG PO TABS: 100.0000 mg | ORAL_TABLET | Freq: Every day | ORAL | Status: DC

## 2015-07-15 MED ORDER — VENLAFAXINE HCL ER 150 MG PO CP24: 150.0000 mg | ORAL_CAPSULE | Freq: Every day | ORAL | Status: DC

## 2015-07-15 NOTE — Progress Notes (Signed)
[facility] MD Progress Note  07/15/2015 5:02 PM Shirley [name]  MRN:  [id number] Subjective:  Follow-up for this 54 year old woman with a history of major depressive episodes. Has been stable now for quite a while on her current medicine regimen. She had canceled her last couple appointments because as she says she was out of the state. Her lifestyle now is to travel with her husband most of the time and she is now living in [place] while he works there. Patient says her mood has been good and denies any return of suicidality or panic attacks. She has been out of her medicine now for the past few days however and says she is feeling sick since she ran out of her medicine. Denies any psychotic symptoms. She does report that she uses the Phenergan on a fairly regular basis for nausea which is probably mostly related to anxiety attacks Principal Problem: @ Diagnosis:   Patient Active Problem List   Diagnosis Date Noted  . Allergic rhinitis [J30.9] 07/15/2015  . Major depressive disorder, recurrent severe without psychotic features (HCC) [F33.2] 01/08/2015  . 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 H/O adenomatous polyp of colon [Z86.010] 07/04/2014  . Esophagitis, reflux [K21.0] 06/14/2014   Total Time spent with patient: 20 minutes  Past Psychiatric History: patient has a history of several hospitalizations in of suicidality with a diagnosis of depression and anxiety.  Past Medical History:  Past Medical History  Diagnosis Date  . Bipolar disorder (HCC)   . Depression    History reviewed. No pertinent past surgical history. Family History:  Family History  Problem Relation Age of Onset  . Alcohol abuse Father   . Cancer Father   . Anxiety disorder Father   . Depression Father   . Cancer Mother   . Atrial fibrillation Mother   . Hypertension Brother   . Alcohol abuse Brother   . Heart disease Brother    Family Psychiatric   History: family history positive for anxiety and depression Social History:  History  Alcohol Use No     History  Drug Use No    Social History   Social History  . Marital Status: Married    Spouse Name: N/A  . Number of Children: N/A  . Years of Education: N/A   Social History Main Topics  . Smoking status: Former Smoker    Types: Cigarettes    Start date: 07/15/1983    Quit date: 07/14/2013  . Smokeless tobacco: Never Used  . Alcohol Use: No  . Drug Use: No  . Sexual Activity: Not Currently   Other Topics Concern  . None   Social History Narrative   Additional Social History:                         Sleep: Good  Appetite:  Good  Current Medications: Current Outpatient Prescriptions  Medication Sig Dispense Refill  . clonazePAM (KLONOPIN) 0.5 MG tablet Take 1 tablet (0.5 mg total) by mouth 3 (three) times daily. 90 tablet 5  . pantoprazole (PROTONIX) 40 MG tablet     . promethazine (PHENERGAN) 25 MG tablet Take 1 tablet (25 mg total) by mouth every 4 (four) hours as needed for nausea. 60 tablet 5  . traZODone (DESYREL) 100 MG tablet Take 1 tablet (100 mg total) by mouth at bedtime. 30 tablet 5  . venlafaxine XR (EFFEXOR-XR) 150 MG 24 hr capsule Take 1  capsule (150 mg total) by mouth daily. 30 capsule 5  . Vitamin D, Ergocalciferol, (DRISDOL) 50000 UNITS CAPS capsule TAKE ONE CAPSULE BY MOUTH ONCE A WEEK     No current facility-administered medications for this visit.    Lab Results: No results found for this or any previous visit (from the past 48 hour(s)).  Physical Findings: AIMS:  , ,  ,  ,    CIWA:    COWS:     Musculoskeletal: Strength & Muscle Tone: within normal limits Gait & Station: normal Patient leans: N/A  Psychiatric Specialty Exam: ROS  Blood pressure 122/86, pulse 88, temperature 98.4 F (36.9 C), temperature source Tympanic, height 5\' 5"  (1.651 m), weight 215 lb 9.6 oz (97.796 kg), SpO2 97 %.Body mass index is 35.88  kg/(m^2).  General Appearance: Fairly Groomed  Patent attorneyye Contact::  Minimal  Speech:  Clear and Coherent  Volume:  Decreased  Mood:  Euthymic  Affect:  Constricted  Thought Process:  Goal Directed  Orientation:  Full (Time, Place, and Person)  Thought Content:  Negative  Suicidal Thoughts:  No  Homicidal Thoughts:  No  Memory:  Immediate;   Good Recent;   Good Remote;   Good  Judgement:  Good  Insight:  Good  Psychomotor Activity:  Normal  Concentration:  Good  Recall:  Good  Fund of Knowledge:Good  Language: Good  Akathisia:  No  Handed:  Right  AIMS (if indicated):     Assets:  Communication Skills Desire for Improvement Financial Resources/Insurance Housing Intimacy Leisure Time Resilience Social Support  ADL's:  Intact  Cognition: WNL  Sleep:      Treatment Plan Summary: Medication management and Plan patient is on a reasonably modest dose of clonazepam as well as 150 mg of venlafaxine trazodone and when necessary Phenergan. Renew all of the medicines. I will increase the Phenergan at her request to allow her to take 1 or 2 of them per day although she says she still takes them only a couple times a week she will sometimes take 2 of them on the same day. Supportive counseling. Review of medication side effects. Follow-up in 6 months. She agrees to the plan.  [name] 07/15/2015, 5:02 PM

## 2015-07-15 NOTE — Telephone Encounter (Signed)
left message to call office back.  

## 2015-07-16 NOTE — Telephone Encounter (Signed)
pt was seen 07-15-15

## 2015-09-04 ENCOUNTER — Telehealth: Payer: Self-pay

## 2015-09-04 NOTE — Telephone Encounter (Signed)
called in rx to pharmacy.   klonipin take 1 tablet 3 times daily  #90 oked for 5 refills.

## 2015-09-04 NOTE — Telephone Encounter (Signed)
pt called left message with lea , front desk that she needed to get a refill on her klonopin.

## 2015-09-04 NOTE — Telephone Encounter (Signed)
pt was told that rx was printed and given to her on  07-15-15. pt states she most have lost it because she did not have it and the pharmacy doesnt have it.

## 2016-01-06 ENCOUNTER — Other Ambulatory Visit: Payer: Self-pay

## 2016-01-06 NOTE — Telephone Encounter (Signed)
pt called states she needs a refill on her trazodone. pt last seen  07-25-15 next appt  02-17-16

## 2016-01-07 ENCOUNTER — Other Ambulatory Visit: Payer: Self-pay

## 2016-01-07 NOTE — Telephone Encounter (Signed)
pt called left message that  she is in [place] and she needs a one month refill on her trazadone.

## 2016-01-08 MED ORDER — TRAZODONE HCL 100 MG PO TABS: 100.0000 mg | ORAL_TABLET | Freq: Every day | ORAL | Status: DC

## 2016-01-08 NOTE — Telephone Encounter (Signed)
pt was called and told rx was sent

## 2016-01-08 NOTE — Telephone Encounter (Signed)
PT CALLED STATES SHE IN [place] AND NEEDS AT LEAST A MONTH REFILL ON HER MEDICATION

## 2016-01-08 NOTE — Telephone Encounter (Signed)
Yes refill

## 2016-01-08 NOTE — Telephone Encounter (Signed)
PT CALLED AGAIN TO FIND OUT ABOUT RX

## 2016-01-08 NOTE — Telephone Encounter (Signed)
pt was called and told rx was sent  

## 2016-01-12 ENCOUNTER — Ambulatory Visit: Payer: Federal, State, Local not specified - PPO | Admitting: Psychiatry

## 2016-02-05 ENCOUNTER — Ambulatory Visit (INDEPENDENT_AMBULATORY_CARE_PROVIDER_SITE_OTHER): Payer: Federal, State, Local not specified - PPO | Admitting: Psychiatry

## 2016-02-05 ENCOUNTER — Encounter: Payer: Self-pay | Admitting: Psychiatry

## 2016-02-05 VITALS — BP 124/82 | HR 91 | Temp 97.7°F | Ht 65.0 in | Wt 217.0 lb

## 2016-02-05 DIAGNOSIS — F331 Major depressive disorder, recurrent, moderate: Secondary | ICD-10-CM | POA: Diagnosis not present

## 2016-02-05 DIAGNOSIS — F4001 Agoraphobia with panic disorder: Secondary | ICD-10-CM

## 2016-02-05 MED ORDER — VENLAFAXINE HCL ER 150 MG PO CP24: 150.0000 mg | ORAL_CAPSULE | Freq: Every day | ORAL | Status: DC

## 2016-02-05 MED ORDER — TRAZODONE HCL 100 MG PO TABS: 100.0000 mg | ORAL_TABLET | Freq: Every day | ORAL | Status: DC

## 2016-02-05 MED ORDER — CLONAZEPAM 0.5 MG PO TABS
0.5000 mg | ORAL_TABLET | Freq: Three times a day (TID) | ORAL | Status: DC
Start: 2016-02-05 — End: 2016-09-16

## 2016-02-05 MED ORDER — PROMETHAZINE HCL 25 MG PO TABS: 25.0000 mg | ORAL_TABLET | ORAL | Status: DC | PRN
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
## 2016-02-17 ENCOUNTER — Ambulatory Visit: Payer: Federal, State, Local not specified - PPO | Admitting: Psychiatry

## 2016-08-03 ENCOUNTER — Telehealth: Payer: Self-pay

## 2016-08-03 DIAGNOSIS — F4001 Agoraphobia with panic disorder: Secondary | ICD-10-CM

## 2016-08-03 NOTE — Telephone Encounter (Signed)
Medication refill request - [facility] from [facility] [place] for [place] refill of patient's prescribed Trazodone.  Patient last provided Rx 02/05/16 + 2 and return eval set for 08/24/16.

## 2016-08-04 ENCOUNTER — Other Ambulatory Visit: Payer: Self-pay | Admitting: Psychiatry

## 2016-08-04 MED ORDER — TRAZODONE HCL 100 MG PO TABS: 100.0000 mg | ORAL_TABLET | Freq: Every day | ORAL | 0 refills | Status: DC

## 2016-08-04 NOTE — Telephone Encounter (Signed)
Thank you :)

## 2016-08-04 NOTE — Telephone Encounter (Signed)
New prescription for patient's prescribed Trazodone 100 mg, one at bedtime, #30 with no refills e-scribed to requested [facility] in [place], [place] as authorized by Dr. Toni Amendlapacs.  Patient to return for next evaluation on 08/24/16.

## 2016-08-04 NOTE — Telephone Encounter (Signed)
Yes, please. I can refill that for one month to get her to appt. Thannks.

## 2016-08-10 ENCOUNTER — Other Ambulatory Visit: Payer: Self-pay | Admitting: Psychiatry

## 2016-08-10 MED ORDER — VENLAFAXINE HCL ER 150 MG PO CP24: 150.0000 mg | ORAL_CAPSULE | Freq: Every day | ORAL | 5 refills | Status: DC

## 2016-08-24 ENCOUNTER — Ambulatory Visit: Payer: Federal, State, Local not specified - PPO | Admitting: Psychiatry

## 2016-08-27 ENCOUNTER — Other Ambulatory Visit: Payer: Self-pay | Admitting: Psychiatry

## 2016-08-27 DIAGNOSIS — F4001 Agoraphobia with panic disorder: Secondary | ICD-10-CM

## 2016-09-16 ENCOUNTER — Encounter: Payer: Self-pay | Admitting: Psychiatry

## 2016-09-16 ENCOUNTER — Ambulatory Visit (INDEPENDENT_AMBULATORY_CARE_PROVIDER_SITE_OTHER): Payer: Medicare Other | Admitting: Psychiatry

## 2016-09-16 DIAGNOSIS — F4001 Agoraphobia with panic disorder: Secondary | ICD-10-CM

## 2016-09-16 MED ORDER — TRAZODONE HCL 100 MG PO TABS: 100.0000 mg | ORAL_TABLET | Freq: Every day | ORAL | 5 refills | Status: DC

## 2016-09-16 MED ORDER — CLONAZEPAM 0.5 MG PO TABS: 0.5000 mg | ORAL_TABLET | Freq: Three times a day (TID) | ORAL | 5 refills | Status: DC

## 2016-09-16 MED ORDER — PROMETHAZINE HCL 25 MG PO TABS: 25.0000 mg | ORAL_TABLET | ORAL | 5 refills | Status: DC | PRN

## 2016-09-16 MED ORDER — VENLAFAXINE HCL ER 150 MG PO CP24: 150.0000 mg | ORAL_CAPSULE | Freq: Every day | ORAL | 5 refills | Status: DC

## 2016-09-30 ENCOUNTER — Other Ambulatory Visit: Payer: Self-pay | Admitting: Psychiatry

## 2016-09-30 DIAGNOSIS — F4001 Agoraphobia with panic disorder: Secondary | ICD-10-CM

## 2016-10-05 ENCOUNTER — Other Ambulatory Visit: Payer: Self-pay | Admitting: Psychiatry

## 2017-03-10 ENCOUNTER — Ambulatory Visit: Payer: Federal, State, Local not specified - PPO | Admitting: Psychiatry

## 2017-03-11 ENCOUNTER — Other Ambulatory Visit: Payer: Self-pay | Admitting: Psychiatry

## 2017-03-11 DIAGNOSIS — F4001 Agoraphobia with panic disorder: Secondary | ICD-10-CM

## 2017-04-27 ENCOUNTER — Other Ambulatory Visit: Payer: Self-pay | Admitting: Psychiatry

## 2017-04-27 DIAGNOSIS — F4001 Agoraphobia with panic disorder: Secondary | ICD-10-CM

## 2017-04-27 NOTE — Telephone Encounter (Signed)
pt called states she needs a refill on all her medications. pt states that she needs refills on her trazodone, klonopin, effexor. pt states that if [facility] can not send [facility] to [facility] [place] then [facility] to [facility] and she will have them transfer to [place].   Pt needs trazodone, klonopin, effexor.   Pt was told that she really should find a doctor in [place] because she hasnt been seen but once a year at our facility. And she had several canceled appt. That she needed to be maintain on medication management.

## 2017-04-29 ENCOUNTER — Telehealth: Payer: Self-pay

## 2017-04-29 NOTE — Telephone Encounter (Signed)
Pt called states that she needs a refill on her medications   Medication Detail    Disp Refills Start End   venlafaxine XR (EFFEXOR-XR) 150 MG 24 hr capsule 30 capsule 5 09/16/2016    Sig - Route: Take 1 capsule (150 mg total) by mouth daily. - Oral   Sent to pharmacy as: venlafaxine XR (EFFEXOR-XR) 150 MG 24 hr capsule   E-Prescribing Status: Receipt confirmed by pharmacy (09/16/2016 1:32 PM EST)

## 2017-05-01 ENCOUNTER — Other Ambulatory Visit: Payer: Self-pay | Admitting: Psychiatry

## 2017-05-01 DIAGNOSIS — F4001 Agoraphobia with panic disorder: Secondary | ICD-10-CM

## 2017-05-18 ENCOUNTER — Telehealth: Payer: Self-pay

## 2017-05-18 ENCOUNTER — Other Ambulatory Visit: Payer: Self-pay | Admitting: Psychiatry

## 2017-05-18 DIAGNOSIS — F4001 Agoraphobia with panic disorder: Secondary | ICD-10-CM

## 2017-05-18 MED ORDER — TRAZODONE HCL 100 MG PO TABS: ORAL_TABLET | ORAL | 2 refills | Status: DC

## 2017-05-18 NOTE — Telephone Encounter (Signed)
pt called states she got her medication but she needs the trazodone. pt states she has appt in oct and she needs enough to get to appt.

## 2017-05-18 NOTE — Telephone Encounter (Signed)
Okay. I will try to electronically prescribed that if possible

## 2017-05-26 ENCOUNTER — Other Ambulatory Visit: Payer: Self-pay | Admitting: Psychiatry

## 2017-06-21 ENCOUNTER — Encounter: Payer: Self-pay | Admitting: Psychiatry

## 2017-06-21 ENCOUNTER — Ambulatory Visit (INDEPENDENT_AMBULATORY_CARE_PROVIDER_SITE_OTHER): Payer: Medicare Other | Admitting: Psychiatry

## 2017-06-21 VITALS — Temp 99.3°F | Wt 212.0 lb

## 2017-06-21 DIAGNOSIS — F4001 Agoraphobia with panic disorder: Secondary | ICD-10-CM

## 2017-06-21 DIAGNOSIS — F331 Major depressive disorder, recurrent, moderate: Secondary | ICD-10-CM

## 2017-06-21 MED ORDER — TRAZODONE HCL 100 MG PO TABS: ORAL_TABLET | ORAL | 5 refills | Status: DC

## 2017-06-21 MED ORDER — VENLAFAXINE HCL ER 150 MG PO CP24: ORAL_CAPSULE | ORAL | 5 refills | Status: DC

## 2017-06-21 MED ORDER — PROMETHAZINE HCL 25 MG PO TABS: 25.0000 mg | ORAL_TABLET | ORAL | 5 refills | Status: DC | PRN

## 2017-06-21 MED ORDER — CLONAZEPAM 0.5 MG PO TABS: 0.5000 mg | ORAL_TABLET | Freq: Three times a day (TID) | ORAL | 5 refills | Status: DC

## 2017-06-21 NOTE — Progress Notes (Signed)
Follow-up for 56 year old woman with recurrent depression and anxiety. Patient is back in town briefly from her long stay in [place]. Generally has been doing quite well. No return of major depression. No return of panic attacks. OCD-like symptoms are under good control. Tolerating medicine well. She gained a lot of weight which she still does not understand but is now losing it by staying on what sounds like a low carb diet. Feeling healthy.  Casually dressed. Euthymic affect. Appropriate interaction. Thoughts lucid. No sign of psychosis. No suicidal or homicidal ideation. Alert and oriented and cognitively intact.  Renewed all of her medicine. Paper prescription given for the Klonopin and the others I ask her where she would like them to go and she wants him to go to her [facility] in [place]. Refills with 5 months. She can make an appointment when she is back in town as needed.

## 2017-12-14 ENCOUNTER — Telehealth: Payer: Self-pay

## 2017-12-14 ENCOUNTER — Other Ambulatory Visit: Payer: Self-pay | Admitting: Psychiatry

## 2017-12-14 DIAGNOSIS — F4001 Agoraphobia with panic disorder: Secondary | ICD-10-CM

## 2017-12-14 MED ORDER — TRAZODONE HCL 100 MG PO TABS: ORAL_TABLET | ORAL | 1 refills | Status: DC

## 2017-12-14 MED ORDER — VENLAFAXINE HCL ER 150 MG PO CP24: ORAL_CAPSULE | ORAL | 1 refills | Status: DC

## 2017-12-14 MED ORDER — CLONAZEPAM 0.5 MG PO TABS: 0.5000 mg | ORAL_TABLET | Freq: Three times a day (TID) | ORAL | 1 refills | Status: DC

## 2017-12-14 MED ORDER — PROMETHAZINE HCL 25 MG PO TABS: 25.0000 mg | ORAL_TABLET | ORAL | 1 refills | Status: DC | PRN

## 2017-12-14 NOTE — Telephone Encounter (Signed)
It's fine. I know she has circumstances. I went ahead and ordered the refills at her [facility] [place] ,[place]. I'd still like to see her when she is back in the area.

## 2017-12-14 NOTE — Telephone Encounter (Signed)
pt states that she needs a refill on her medications pt states she in [place] and that she plans on coming back to [place] in may.  pt was advised that she should find a doctor in [place] that she has not been seen in since oct of last year and that usually if a patient has not been seen in the last 6 mths they are considered a new pt.  It it very important that pt maintain appt for medications.  I told pt that I would send a not but that she really should get a doctor in [place].

## 2017-12-28 ENCOUNTER — Other Ambulatory Visit: Payer: Self-pay | Admitting: Psychiatry

## 2017-12-28 DIAGNOSIS — F4001 Agoraphobia with panic disorder: Secondary | ICD-10-CM

## 2018-01-10 ENCOUNTER — Ambulatory Visit (INDEPENDENT_AMBULATORY_CARE_PROVIDER_SITE_OTHER): Payer: Medicare Other | Admitting: Psychiatry

## 2018-01-10 ENCOUNTER — Encounter: Payer: Self-pay | Admitting: Psychiatry

## 2018-01-10 ENCOUNTER — Other Ambulatory Visit: Payer: Self-pay

## 2018-01-10 VITALS — BP 138/84 | Temp 98.3°F | Wt 214.2 lb

## 2018-01-10 DIAGNOSIS — F331 Major depressive disorder, recurrent, moderate: Secondary | ICD-10-CM | POA: Diagnosis not present

## 2018-01-10 DIAGNOSIS — F4001 Agoraphobia with panic disorder: Secondary | ICD-10-CM

## 2018-01-10 MED ORDER — TRAZODONE HCL 100 MG PO TABS: ORAL_TABLET | ORAL | 5 refills | Status: DC

## 2018-01-10 MED ORDER — CLONAZEPAM 0.5 MG PO TABS: 0.5000 mg | ORAL_TABLET | Freq: Three times a day (TID) | ORAL | 5 refills | Status: DC

## 2018-01-10 MED ORDER — PROMETHAZINE HCL 25 MG PO TABS: 25.0000 mg | ORAL_TABLET | ORAL | 5 refills | Status: DC | PRN

## 2018-01-10 MED ORDER — VENLAFAXINE HCL ER 150 MG PO CP24: ORAL_CAPSULE | ORAL | 5 refills | Status: DC

## 2018-01-10 NOTE — Progress Notes (Signed)
Follow-up for 57 year old woman with chronic depression and anxiety.  No new complaints.  Patient continues to live most of the time in [place] although she says that the family may be relocating back to [place] soon.  Patient has no new symptoms.  Mood is been stable.  Some anxiety when she was transiently out of her medicine but no seizures no sign of any bad outcome.  Patient is casually dressed and neatly groomed good eye contact normal psychomotor activity speech normal rate tone and volume.  Affect euthymic thoughts lucid.  Patient appears to be stable with no concerns about her medicine.  Patient is doing well.  Supportive counseling and review of medication usage.  Renew medicines follow-up 6 months.

## 2018-08-20 ENCOUNTER — Other Ambulatory Visit: Payer: Self-pay | Admitting: Psychiatry

## 2018-08-20 DIAGNOSIS — F4001 Agoraphobia with panic disorder: Secondary | ICD-10-CM

## 2018-09-14 ENCOUNTER — Other Ambulatory Visit: Payer: Self-pay | Admitting: Psychiatry

## 2018-09-14 DIAGNOSIS — F4001 Agoraphobia with panic disorder: Secondary | ICD-10-CM

## 2018-09-18 NOTE — Telephone Encounter (Signed)
pt needs refills on her medications 

## 2018-09-28 ENCOUNTER — Other Ambulatory Visit: Payer: Self-pay | Admitting: Psychiatry

## 2018-09-28 ENCOUNTER — Telehealth: Payer: Self-pay

## 2018-09-28 DIAGNOSIS — F4001 Agoraphobia with panic disorder: Secondary | ICD-10-CM

## 2018-09-28 NOTE — Telephone Encounter (Signed)
Medication refill request - Pt's husband stopped by the office to request a refill of pt's prescribed Venlafaxine XR and Trazodone.  Requested these be sent to the [facility] [phone] in [place], [place] as will be out before appt scheduled 10/19/18.  Requested Dr. Toni Amend send these today.

## 2018-09-29 ENCOUNTER — Other Ambulatory Visit: Payer: Self-pay | Admitting: Psychiatry

## 2018-09-29 DIAGNOSIS — F4001 Agoraphobia with panic disorder: Secondary | ICD-10-CM

## 2018-09-29 NOTE — Telephone Encounter (Signed)
Done

## 2018-10-02 ENCOUNTER — Ambulatory Visit: Payer: Medicare Other | Admitting: Psychiatry

## 2018-10-19 ENCOUNTER — Ambulatory Visit (INDEPENDENT_AMBULATORY_CARE_PROVIDER_SITE_OTHER): Payer: Medicare Other | Admitting: Psychiatry

## 2018-10-19 ENCOUNTER — Encounter: Payer: Self-pay | Admitting: Psychiatry

## 2018-10-19 ENCOUNTER — Other Ambulatory Visit: Payer: Self-pay

## 2018-10-19 VITALS — BP 137/88 | HR 77 | Temp 99.2°F | Wt 235.8 lb

## 2018-10-19 DIAGNOSIS — F4001 Agoraphobia with panic disorder: Secondary | ICD-10-CM

## 2018-10-19 DIAGNOSIS — F331 Major depressive disorder, recurrent, moderate: Secondary | ICD-10-CM

## 2018-10-19 MED ORDER — VENLAFAXINE HCL ER 150 MG PO CP24: ORAL_CAPSULE | ORAL | 5 refills | Status: DC

## 2018-10-19 MED ORDER — PROMETHAZINE HCL 25 MG PO TABS: 25.0000 mg | ORAL_TABLET | ORAL | 5 refills | Status: DC | PRN

## 2018-10-19 MED ORDER — TRAZODONE HCL 100 MG PO TABS: ORAL_TABLET | ORAL | 5 refills | Status: DC

## 2018-10-19 MED ORDER — CLONAZEPAM 0.5 MG PO TABS: ORAL_TABLET | ORAL | 5 refills | Status: DC

## 2018-10-19 NOTE — Progress Notes (Signed)
Follow-up patient with chronic anxiety and depression.  No new complaints.  Mood has been stable.  No panic attacks.  Keeping medicine and mood under good control.  Patient is alert and oriented.  Affect euthymic.  Denies suicidal ideation.  No evident psychosis.  No evidence of medicine misuse.  Spoke about her obligations to her daughter and her frustration with living in [place].  Patient indicates that she plans to relocate permanently back up to [place] within the next few months.  No change to medicine.  Everything refilled at her current location.  Follow-up 6 months.

## 2019-04-23 ENCOUNTER — Other Ambulatory Visit: Payer: Self-pay | Admitting: Psychiatry

## 2019-04-23 DIAGNOSIS — F4001 Agoraphobia with panic disorder: Secondary | ICD-10-CM

## 2019-04-24 ENCOUNTER — Other Ambulatory Visit: Payer: Self-pay | Admitting: Psychiatry

## 2019-04-24 MED ORDER — VENLAFAXINE HCL ER 150 MG PO CP24: ORAL_CAPSULE | ORAL | 5 refills | Status: DC

## 2019-04-24 NOTE — Telephone Encounter (Signed)
pt called states she needs a refill on the venlafaxine [facility] to [facility] in [place], [place]

## 2019-04-25 ENCOUNTER — Other Ambulatory Visit: Payer: Self-pay | Admitting: Psychiatry

## 2019-04-25 DIAGNOSIS — F4001 Agoraphobia with panic disorder: Secondary | ICD-10-CM

## 2019-04-25 MED ORDER — TRAZODONE HCL 100 MG PO TABS: ORAL_TABLET | ORAL | 5 refills | Status: DC

## 2019-05-10 ENCOUNTER — Other Ambulatory Visit: Payer: Self-pay

## 2019-05-10 ENCOUNTER — Ambulatory Visit (INDEPENDENT_AMBULATORY_CARE_PROVIDER_SITE_OTHER): Payer: Medicare Other | Admitting: Psychiatry

## 2019-05-10 DIAGNOSIS — F4001 Agoraphobia with panic disorder: Secondary | ICD-10-CM | POA: Diagnosis not present

## 2019-05-10 DIAGNOSIS — F331 Major depressive disorder, recurrent, moderate: Secondary | ICD-10-CM

## 2019-05-10 MED ORDER — CLONAZEPAM 0.5 MG PO TABS: ORAL_TABLET | ORAL | 5 refills | Status: DC

## 2019-05-10 MED ORDER — PROMETHAZINE HCL 25 MG PO TABS: 25.0000 mg | ORAL_TABLET | ORAL | 5 refills | Status: DC | PRN

## 2019-05-10 MED ORDER — VENLAFAXINE HCL ER 75 MG PO CP24: ORAL_CAPSULE | ORAL | 5 refills | Status: DC

## 2019-05-10 MED ORDER — TRAZODONE HCL 100 MG PO TABS: ORAL_TABLET | ORAL | 5 refills | Status: DC

## 2019-05-10 NOTE — Progress Notes (Signed)
Spoke to patient by telephone.  Patient was engaged and appropriate.  She reports that her mood is been more depressed recently.  Her energy level is low.  She feels like she ruminates and worries a lot of the time.  She is no longer living in [place] but has come back to [place].  She feels a sense of guilt about her daughters bad marriage and ruminates about that.  Her ex-husband also died of the coronavirus last month which has been a big shock and sadness for her.  No report of suicidal ideation.  No psychotic symptoms.  Not having panic symptoms.  Tolerating medicine okay.  Patient sounds down and flat.  Lucid however and appropriate in interaction.  No evidence of psychosis.  No evidence of suicidality.  Supportive counseling.  Review things that she can be doing to try and keep her self active.  We reviewed her medication and I suggested an increase of her Effexor to 225 mg a day would be appropriate.  Patient agrees to the plan.  I have put in an order to [facility] and would like to see her back in about 3 months.

## 2019-07-31 ENCOUNTER — Other Ambulatory Visit: Payer: Self-pay

## 2019-07-31 ENCOUNTER — Ambulatory Visit (INDEPENDENT_AMBULATORY_CARE_PROVIDER_SITE_OTHER): Payer: Medicare Other | Admitting: Psychiatry

## 2019-07-31 DIAGNOSIS — F4001 Agoraphobia with panic disorder: Secondary | ICD-10-CM

## 2019-07-31 DIAGNOSIS — F331 Major depressive disorder, recurrent, moderate: Secondary | ICD-10-CM

## 2019-07-31 NOTE — Progress Notes (Signed)
Patient seen and chart reviewed.  Patient was contacted by telephone.  She was available attentive and appropriately interactive.  Lucid and appropriate conversation.  Stated that she has had no psychiatric concerns in the last few months.  No episodes of major depression.  No suicidal thoughts.  No panic attacks.  She mentions having weight gain which has been a chronic concern for her.  She knows that she does better when she can work on controlling her diet.  Patient has no suicidal thoughts or homicidal thoughts or evidence of psychosis.  Tolerating medicine with no complaints of any side effects.  Sleeping well.  Patient was appropriate and interactive.  Affect sounded euthymic thoughts appeared to be lucid with good judgment.  We reviewed medications and I have made sure that all of the prescription should still be valid for another 3 months.  Follow-up in 3 months.  Patient reminded that she can always call sooner if necessary.

## 2019-10-23 ENCOUNTER — Other Ambulatory Visit: Payer: Self-pay | Admitting: Psychiatry

## 2019-10-23 DIAGNOSIS — F4001 Agoraphobia with panic disorder: Secondary | ICD-10-CM

## 2019-11-21 ENCOUNTER — Other Ambulatory Visit: Payer: Self-pay | Admitting: Psychiatry

## 2019-12-11 ENCOUNTER — Other Ambulatory Visit: Payer: Self-pay

## 2019-12-11 ENCOUNTER — Ambulatory Visit (INDEPENDENT_AMBULATORY_CARE_PROVIDER_SITE_OTHER): Payer: Medicare Other | Admitting: Psychiatry

## 2019-12-11 DIAGNOSIS — F419 Anxiety disorder, unspecified: Secondary | ICD-10-CM

## 2019-12-11 DIAGNOSIS — F429 Obsessive-compulsive disorder, unspecified: Secondary | ICD-10-CM

## 2019-12-11 DIAGNOSIS — F4001 Agoraphobia with panic disorder: Secondary | ICD-10-CM

## 2019-12-11 DIAGNOSIS — F331 Major depressive disorder, recurrent, moderate: Secondary | ICD-10-CM

## 2019-12-11 MED ORDER — CLONAZEPAM 0.5 MG PO TABS: ORAL_TABLET | ORAL | 5 refills | Status: DC

## 2019-12-11 MED ORDER — VENLAFAXINE HCL ER 75 MG PO CP24: ORAL_CAPSULE | ORAL | 5 refills | Status: DC

## 2019-12-11 MED ORDER — TRAZODONE HCL 100 MG PO TABS: ORAL_TABLET | ORAL | 5 refills | Status: DC

## 2019-12-11 MED ORDER — PROMETHAZINE HCL 25 MG PO TABS: 25.0000 mg | ORAL_TABLET | ORAL | 5 refills | Status: DC | PRN

## 2019-12-11 NOTE — Progress Notes (Signed)
Follow-up patient with chronic anxiety and OCD.  Reach patient by telephone.  Patient was on time appropriate and lucid.  She has no specific mental health complaints.  Reports that her anxiety and depression have both been under good control recently.  She continues to take medication as prescribed.  Recently she has been trying to cut down her eating and has lost some weight which is very pleasing to her.  Alert and oriented.  Mood stated as okay.  No evidence of delusional or disorganized thinking.  Denies suicidal or homicidal ideation.  No sign of psychotic thinking.  Reviewed medication prescriptions.  Supportive therapy and counseling.  Renew all prescriptions follow-up 3 months.

## 2020-04-15 ENCOUNTER — Telehealth: Payer: Medicare Other | Admitting: Psychiatry

## 2020-04-15 ENCOUNTER — Other Ambulatory Visit: Payer: Self-pay | Admitting: Psychiatry

## 2020-04-15 ENCOUNTER — Other Ambulatory Visit: Payer: Self-pay

## 2020-04-15 DIAGNOSIS — F4001 Agoraphobia with panic disorder: Secondary | ICD-10-CM

## 2020-04-15 MED ORDER — VENLAFAXINE HCL ER 75 MG PO CP24: ORAL_CAPSULE | ORAL | 5 refills | Status: DC

## 2020-04-15 MED ORDER — TRAZODONE HCL 100 MG PO TABS: ORAL_TABLET | ORAL | 5 refills | Status: DC

## 2020-04-15 MED ORDER — CLONAZEPAM 0.5 MG PO TABS: ORAL_TABLET | ORAL | 5 refills | Status: DC

## 2020-05-20 ENCOUNTER — Telehealth (INDEPENDENT_AMBULATORY_CARE_PROVIDER_SITE_OTHER): Payer: Medicare Other | Admitting: Psychiatry

## 2020-05-20 ENCOUNTER — Encounter: Payer: Self-pay | Admitting: Psychiatry

## 2020-05-20 ENCOUNTER — Other Ambulatory Visit: Payer: Self-pay

## 2020-05-20 DIAGNOSIS — F4001 Agoraphobia with panic disorder: Secondary | ICD-10-CM

## 2020-05-20 DIAGNOSIS — F331 Major depressive disorder, recurrent, moderate: Secondary | ICD-10-CM

## 2020-05-20 MED ORDER — TRAZODONE HCL 100 MG PO TABS: ORAL_TABLET | ORAL | 5 refills | Status: DC

## 2020-05-20 MED ORDER — CLONAZEPAM 0.5 MG PO TABS: ORAL_TABLET | ORAL | 5 refills | Status: DC

## 2020-05-20 MED ORDER — PROMETHAZINE HCL 25 MG PO TABS: 25.0000 mg | ORAL_TABLET | ORAL | 5 refills | Status: DC | PRN

## 2020-05-20 MED ORDER — VENLAFAXINE HCL ER 75 MG PO CP24
ORAL_CAPSULE | ORAL | 5 refills | Status: DC
Start: 2020-05-20 — End: 2020-12-16

## 2020-05-20 NOTE — Progress Notes (Signed)
Follow-up psychiatric note for this 59 year old woman with chronic anxiety and depression.  Patient was reached by telephone.  Identities were established.  Total time on the telephone 10 minutes.  Patient states that she has no new complaint.  Since our last conversation her mood has been stable.  She has had no spells of depression.  No episodes of severe anxiety no episodes of mania.  Only physical problem of note to her is that she had a bad reaction to her first COVID vaccine but still plans to get the next 1.  Patient was alert and oriented and appropriate.  Affect euthymic and reactive.  Thoughts lucid.  Denied suicidal ideation denied psychotic symptoms.  Good insight and judgment.  Reviewed all medications including controlled and noncontrolled substances.  Reviewed pros and cons of medication and reasons for use.  Patient consents to continue medication.  Everything will be renewed at her current pharmacy.  Talk again in 6 months.

## 2020-08-22 ENCOUNTER — Other Ambulatory Visit: Payer: Self-pay | Admitting: Psychiatry

## 2020-08-22 DIAGNOSIS — F4001 Agoraphobia with panic disorder: Secondary | ICD-10-CM

## 2020-08-25 ENCOUNTER — Other Ambulatory Visit: Payer: Self-pay | Admitting: Psychiatry

## 2020-08-25 DIAGNOSIS — F4001 Agoraphobia with panic disorder: Secondary | ICD-10-CM

## 2020-08-26 ENCOUNTER — Other Ambulatory Visit: Payer: Self-pay | Admitting: Psychiatry

## 2020-08-26 DIAGNOSIS — F4001 Agoraphobia with panic disorder: Secondary | ICD-10-CM

## 2020-08-26 MED ORDER — CLONAZEPAM 0.5 MG PO TABS: ORAL_TABLET | ORAL | 5 refills | Status: DC

## 2020-12-13 ENCOUNTER — Other Ambulatory Visit: Payer: Self-pay | Admitting: Psychiatry

## 2020-12-13 DIAGNOSIS — F4001 Agoraphobia with panic disorder: Secondary | ICD-10-CM

## 2020-12-16 ENCOUNTER — Telehealth (INDEPENDENT_AMBULATORY_CARE_PROVIDER_SITE_OTHER): Payer: Medicare Other | Admitting: Psychiatry

## 2020-12-16 ENCOUNTER — Other Ambulatory Visit: Payer: Self-pay

## 2020-12-16 DIAGNOSIS — F331 Major depressive disorder, recurrent, moderate: Secondary | ICD-10-CM | POA: Diagnosis not present

## 2020-12-16 DIAGNOSIS — F4001 Agoraphobia with panic disorder: Secondary | ICD-10-CM | POA: Diagnosis not present

## 2020-12-16 MED ORDER — VENLAFAXINE HCL ER 75 MG PO CP24: ORAL_CAPSULE | ORAL | 5 refills | Status: DC

## 2020-12-16 MED ORDER — CLONAZEPAM 0.5 MG PO TABS: ORAL_TABLET | ORAL | 5 refills | Status: DC

## 2020-12-16 MED ORDER — TRAZODONE HCL 100 MG PO TABS: ORAL_TABLET | ORAL | 5 refills | Status: DC

## 2020-12-16 NOTE — Progress Notes (Signed)
Virtual Visit via Telephone Note  I connected with Shirley [name] on 12/16/20 at  1:40 PM EDT by telephone and verified that I am speaking with the correct person using two identifiers.  Location: Patient: Home Provider: Hospital   I discussed the limitations, risks, security and privacy concerns of performing an evaluation and management service by telephone and the availability of in person appointments. I also discussed with the patient that there may be a patient responsible charge related to this service. The patient expressed understanding and agreed to proceed.   History of Present Illness: Patient reached by telephone.  On time and appropriate.  No new complaints.  Mood has been fine.  No panic attacks.  Sleeping adequately.  Wants to consider tapering off of trazodone so we talked about how she should cut it in half for a couple weeks first before stopping it if she wants to.  She is working on trying to lose weight.  Health otherwise okay.  Elderly family members continue to be a stress but the patient is trying to not let it bother her.    Observations/Objective: Alert and oriented.  Affect euthymic.  Mood fine.  No sign of disorganized thinking.  No suicidal ideation.  Tolerating medicine well.  No sign of any abuse.   Assessment and Plan: Patient is requesting that we follow-up in a year.  Refilled prescriptions for 6 months at which point they can call for a refill.  Follow-up 12 months  Follow Up Instructions:    I discussed the assessment and treatment plan with the patient. The patient was provided an opportunity to ask questions and all were answered. The patient agreed with the plan and demonstrated an understanding of the instructions.   The patient was advised to call back or seek an in-person evaluation if the symptoms worsen or if the condition fails to improve as anticipated.  I provided 20 minutes of non-face-to-face time during this encounter.   Mordecai Rasmussen,  MD

## 2020-12-26 ENCOUNTER — Other Ambulatory Visit: Payer: Self-pay | Admitting: Psychiatry

## 2021-06-16 ENCOUNTER — Telehealth (INDEPENDENT_AMBULATORY_CARE_PROVIDER_SITE_OTHER): Payer: Medicare Other | Admitting: Psychiatry

## 2021-06-16 ENCOUNTER — Other Ambulatory Visit: Payer: Self-pay

## 2021-06-16 DIAGNOSIS — F331 Major depressive disorder, recurrent, moderate: Secondary | ICD-10-CM | POA: Diagnosis not present

## 2021-06-16 DIAGNOSIS — F4001 Agoraphobia with panic disorder: Secondary | ICD-10-CM

## 2021-06-16 MED ORDER — TRAZODONE HCL 100 MG PO TABS: 100.0000 mg | ORAL_TABLET | Freq: Every day | ORAL | 5 refills | Status: DC

## 2021-06-16 MED ORDER — PROMETHAZINE HCL 25 MG PO TABS: 25.0000 mg | ORAL_TABLET | Freq: Four times a day (QID) | ORAL | 5 refills | Status: DC | PRN

## 2021-06-16 MED ORDER — VENLAFAXINE HCL ER 75 MG PO CP24: ORAL_CAPSULE | ORAL | 5 refills | Status: DC

## 2021-06-16 MED ORDER — CLONAZEPAM 0.5 MG PO TABS: ORAL_TABLET | ORAL | 5 refills | Status: DC

## 2021-06-16 NOTE — Progress Notes (Signed)
Virtual Visit via Telephone Note  I connected with Shirley [name] on 06/16/21 at  1:20 PM EDT by telephone and verified that I am speaking with the correct person using two identifiers.  Location: Patient: Home Provider: Hospital   I discussed the limitations, risks, security and privacy concerns of performing an evaluation and management service by telephone and the availability of in person appointments. I also discussed with the patient that there may be a patient responsible charge related to this service. The patient expressed understanding and agreed to proceed.   History of Present Illness: Patient reached by telephone for follow-up for patient with chronic anxiety and depression.  Patient describes current stresses being once again involved in providing care for relatives this time her sister-in-law who is apparently dying and her own mother who has advanced Alzheimer's.  Tired out much of the time.  Still able to sleep at night.  Tolerating medicine okay.  Denies suicidal thoughts.   Observations/Objective: Seems a little tired but lucid.  Denies suicidal thoughts.  No sign of thought disorder or disorganization.   Assessment and Plan: Encouraged patient to make sure that she was getting some help with this.  I remember and I am sure she does as well that this sort of thing is what has really put a strain on her in the past to the point of needing hospitalization.  She is able to tell me that despite all of the stress she continues to function and has not spiraled down into the kind of severe panicky almost dissociative psychosis like anxiety that she has had in the past.  Encourage her again to make sure she is getting some rest and getting some help which she says will be forthcoming.  Continue all medicines review medications.  Prescriptions completed.  Follow-up 6 months.   Follow Up Instructions:    I discussed the assessment and treatment plan with the patient. The patient was  provided an opportunity to ask questions and all were answered. The patient agreed with the plan and demonstrated an understanding of the instructions.   The patient was advised to call back or seek an in-person evaluation if the symptoms worsen or if the condition fails to improve as anticipated.  I provided 20 minutes of non-face-to-face time during this encounter.   Mordecai Rasmussen, MD

## 2021-06-23 ENCOUNTER — Other Ambulatory Visit: Payer: Self-pay

## 2021-12-17 ENCOUNTER — Telehealth (INDEPENDENT_AMBULATORY_CARE_PROVIDER_SITE_OTHER): Payer: Medicare Other | Admitting: Psychiatry

## 2021-12-17 DIAGNOSIS — F4001 Agoraphobia with panic disorder: Secondary | ICD-10-CM | POA: Diagnosis not present

## 2021-12-17 DIAGNOSIS — F331 Major depressive disorder, recurrent, moderate: Secondary | ICD-10-CM

## 2021-12-17 MED ORDER — TRAZODONE HCL 100 MG PO TABS: 100.0000 mg | ORAL_TABLET | Freq: Every day | ORAL | 5 refills | Status: DC

## 2021-12-17 MED ORDER — CLONAZEPAM 0.5 MG PO TABS: ORAL_TABLET | ORAL | 5 refills | Status: DC

## 2021-12-17 MED ORDER — VENLAFAXINE HCL ER 75 MG PO CP24: ORAL_CAPSULE | ORAL | 5 refills | Status: DC

## 2021-12-17 MED ORDER — PROMETHAZINE HCL 25 MG PO TABS
25.0000 mg | ORAL_TABLET | Freq: Four times a day (QID) | ORAL | 5 refills | Status: DC | PRN
Start: 2021-12-17 — End: 2022-06-22

## 2021-12-17 NOTE — Progress Notes (Signed)
Virtual Visit via Telephone Note ? ?I connected with Dierdre Searles on 12/17/21 at  1:00 PM EDT by telephone and verified that I am speaking with the correct person using two identifiers. ? ?Location: ?Patient: Home ?Provider: Hospital ?  ?I discussed the limitations, risks, security and privacy concerns of performing an evaluation and management service by telephone and the availability of in person appointments. I also discussed with the patient that there may be a patient responsible charge related to this service. The patient expressed understanding and agreed to proceed. ? ? ?History of Present Illness: Patient reached by telephone.  Identities established.  Patient has no new complaints.  Mood is stable.  Anxiety is stable.  No return of major depression or panic attacks.  Has concerns about her weight and is encouraged to talk to her primary care doctor about it.  No problems reported with medicine. ? ?  ?Observations/Objective: Alert and oriented.  Normal affect and euthymic with stated appropriate and stable mood.  Denies suicidal ideation.  No evidence psychosis ? ? ?Assessment and Plan: Refill medications for 6 months.  Follow up in 6 months.  Patient agreeable ? ? ?Follow Up Instructions: ? ?  ?I discussed the assessment and treatment plan with the patient. The patient was provided an opportunity to ask questions and all were answered. The patient agreed with the plan and demonstrated an understanding of the instructions. ?  ?The patient was advised to call back or seek an in-person evaluation if the symptoms worsen or if the condition fails to improve as anticipated. ? ?I provided 20 minutes of non-face-to-face time during this encounter. ? ? ?Mordecai Rasmussen, MD  ?

## 2022-06-18 ENCOUNTER — Other Ambulatory Visit: Payer: Self-pay | Admitting: Psychiatry

## 2022-06-18 DIAGNOSIS — F4001 Agoraphobia with panic disorder: Secondary | ICD-10-CM

## 2022-06-18 MED ORDER — TRAZODONE HCL 100 MG PO TABS: 100.0000 mg | ORAL_TABLET | Freq: Every day | ORAL | 5 refills | Status: DC

## 2022-06-22 ENCOUNTER — Telehealth (INDEPENDENT_AMBULATORY_CARE_PROVIDER_SITE_OTHER): Payer: Medicare Other | Admitting: Psychiatry

## 2022-06-22 DIAGNOSIS — F4001 Agoraphobia with panic disorder: Secondary | ICD-10-CM

## 2022-06-22 DIAGNOSIS — F331 Major depressive disorder, recurrent, moderate: Secondary | ICD-10-CM | POA: Diagnosis not present

## 2022-06-22 MED ORDER — VENLAFAXINE HCL ER 75 MG PO CP24: ORAL_CAPSULE | ORAL | 5 refills | Status: DC

## 2022-06-22 MED ORDER — PROMETHAZINE HCL 25 MG PO TABS: 25.0000 mg | ORAL_TABLET | Freq: Four times a day (QID) | ORAL | 5 refills | Status: DC | PRN

## 2022-06-22 MED ORDER — TRAZODONE HCL 100 MG PO TABS: 100.0000 mg | ORAL_TABLET | Freq: Every day | ORAL | 5 refills | Status: DC

## 2022-06-22 MED ORDER — CLONAZEPAM 0.5 MG PO TABS: ORAL_TABLET | ORAL | 5 refills | Status: DC

## 2022-06-22 NOTE — Progress Notes (Signed)
Virtual Visit via Telephone Note  I connected with Shirley [name] on 06/22/22 at  1:40 PM EDT by telephone and verified that I am speaking with the correct person using two identifiers.  Location: Patient: Home Provider: Hospital   I discussed the limitations, risks, security and privacy concerns of performing an evaluation and management service by telephone and the availability of in person appointments. I also discussed with the patient that there may be a patient responsible charge related to this service. The patient expressed understanding and agreed to proceed.   History of Present Illness: Patient reached by telephone and identities established.  61 year old woman with chronic depression and anxiety.  Patient has no new complaints.  Everything is been very stable.  No new medical issues.  No episodes of major depression or mania.  Anxiety under control sleeping well and eating well no complaints about medicine    Observations/Objective: Alert and oriented.  Quiet as usual but lucid no evidence of any dangerousness   Assessment and Plan: Supportive counseling and review of medications reminded patient that she can always call back if things are worse and needs to speak sooner.  Medicines refilled and we can follow-up 6 months   Follow Up Instructions:    I discussed the assessment and treatment plan with the patient. The patient was provided an opportunity to ask questions and all were answered. The patient agreed with the plan and demonstrated an understanding of the instructions.   The patient was advised to call back or seek an in-person evaluation if the symptoms worsen or if the condition fails to improve as anticipated.  I provided 20 minutes of non-face-to-face time during this encounter.   Alethia Berthold, MD

## 2022-12-14 ENCOUNTER — Telehealth (INDEPENDENT_AMBULATORY_CARE_PROVIDER_SITE_OTHER): Payer: Medicare Other | Admitting: Psychiatry

## 2022-12-14 DIAGNOSIS — F4001 Agoraphobia with panic disorder: Secondary | ICD-10-CM

## 2022-12-14 MED ORDER — CLONAZEPAM 0.5 MG PO TABS: ORAL_TABLET | ORAL | 5 refills | Status: DC

## 2022-12-14 MED ORDER — PROMETHAZINE HCL 25 MG PO TABS: 25.0000 mg | ORAL_TABLET | Freq: Four times a day (QID) | ORAL | 5 refills | Status: DC | PRN

## 2022-12-14 MED ORDER — VENLAFAXINE HCL ER 75 MG PO CP24: ORAL_CAPSULE | ORAL | 5 refills | Status: DC

## 2022-12-14 MED ORDER — TRAZODONE HCL 100 MG PO TABS: 100.0000 mg | ORAL_TABLET | Freq: Every day | ORAL | 5 refills | Status: DC

## 2022-12-14 NOTE — Progress Notes (Signed)
Virtual Visit via Telephone Note  I connected with Shirley [name] on 12/14/22 at  1:40 PM EDT by telephone and verified that I am speaking with the correct person using two identifiers.  Location: Patient: Home Provider: Hospital   I discussed the limitations, risks, security and privacy concerns of performing an evaluation and management service by telephone and the availability of in person appointments. I also discussed with the patient that there may be a patient responsible charge related to this service. The patient expressed understanding and agreed to proceed.   History of Present Illness: Patient reached by telephone.  Patient was on time I was a little late.  Patient has no new complaints.  No panic attacks no anxiety problems.  Mood has been stable.  No changes in medicine    Observations/Objective: Alert and oriented.  Affect euthymic mood stable no panic or anxiety complaints   Assessment and Plan: Stable.  Reviewed medications and side effects.  Refilled everything for 6 months.  Patient has been informed that I am leaving the practice at the end of June and we will need to make arrangements for follow-up treatment.   Follow Up Instructions:    I discussed the assessment and treatment plan with the patient. The patient was provided an opportunity to ask questions and all were answered. The patient agreed with the plan and demonstrated an understanding of the instructions.   The patient was advised to call back or seek an in-person evaluation if the symptoms worsen or if the condition fails to improve as anticipated.  I provided 20 minutes of non-face-to-face time during this encounter.   Alethia Berthold, MD

## 2023-01-17 ENCOUNTER — Encounter (HOSPITAL_COMMUNITY): Payer: Self-pay

## 2023-02-24 ENCOUNTER — Telehealth: Payer: Self-pay | Admitting: Psychiatry

## 2023-03-28 ENCOUNTER — Ambulatory Visit: Payer: Medicare Other | Admitting: Psychiatry
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
## 2023-04-26 ENCOUNTER — Encounter: Payer: Self-pay | Admitting: Psychiatry

## 2023-04-26 ENCOUNTER — Ambulatory Visit (INDEPENDENT_AMBULATORY_CARE_PROVIDER_SITE_OTHER): Payer: Medicare Other | Admitting: Psychiatry

## 2023-04-26 VITALS — BP 138/88 | HR 59 | Temp 98.3°F | Ht 62.6 in | Wt 221.4 lb

## 2023-04-26 DIAGNOSIS — F429 Obsessive-compulsive disorder, unspecified: Secondary | ICD-10-CM

## 2023-04-26 DIAGNOSIS — F3342 Major depressive disorder, recurrent, in full remission: Secondary | ICD-10-CM | POA: Diagnosis not present

## 2023-04-26 MED ORDER — CLONAZEPAM 0.25 MG PO TBDP: 0.2500 mg | ORAL_TABLET | Freq: Every day | ORAL | 0 refills | Status: DC | PRN
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
## 2023-06-13 ENCOUNTER — Telehealth: Payer: Self-pay

## 2023-06-13 ENCOUNTER — Other Ambulatory Visit: Payer: Self-pay | Admitting: Psychiatry

## 2023-06-13 MED ORDER — VENLAFAXINE HCL ER 75 MG PO CP24: 225.0000 mg | ORAL_CAPSULE | Freq: Every day | ORAL | 1 refills | Status: DC

## 2023-06-13 NOTE — Telephone Encounter (Signed)
left message asking patient to call office back.Marland Kitchen

## 2023-06-13 NOTE — Telephone Encounter (Signed)
Could you ask her the dose of trazodone she is taking? I advised her to discontinue clonazepam, so it will not be refilled. The recommendation was to decrease clonazepam by 0.25 mg daily for two weeks, then discontinue. Also, please check how many clonazepam tablets she has left to facilitate this tapering off if she has not done so.

## 2023-06-13 NOTE — Telephone Encounter (Signed)
received fax requesting a refill on the the venlafaxine, trazodone and the clonazepam. pt was last seen on 8-13 next appt 11-18

## 2023-06-14 ENCOUNTER — Ambulatory Visit: Payer: Medicare Other | Admitting: Psychiatry

## 2023-06-14 ENCOUNTER — Other Ambulatory Visit: Payer: Self-pay | Admitting: Psychiatry

## 2023-06-14 DIAGNOSIS — F4001 Agoraphobia with panic disorder: Secondary | ICD-10-CM

## 2023-06-14 MED ORDER — TRAZODONE HCL 100 MG PO TABS: 100.0000 mg | ORAL_TABLET | Freq: Every evening | ORAL | 1 refills | Status: DC | PRN

## 2023-06-14 MED ORDER — CLONAZEPAM 0.25 MG PO TBDP: 0.2500 mg | ORAL_TABLET | Freq: Every day | ORAL | 1 refills | Status: DC | PRN

## 2023-06-14 NOTE — Telephone Encounter (Signed)
pt called back. she taking trazodone 100mg  and she is still taking the 0.25mg  of the clonzaepam. she states that she does not want to come off of that right now. but she is taking the lower dosage.

## 2023-06-14 NOTE — Telephone Encounter (Signed)
Ordered trazodone, and clonazepam 0.25 mg tabs to last until the next visit.

## 2023-06-15 NOTE — Telephone Encounter (Signed)
left message that rx was sent to the pharmacy  

## 2023-07-25 NOTE — Progress Notes (Signed)
BH MD/PA/NP OP Progress Note  08/01/2023 9:12 AM Shirley [name]  MRN:  [id number]  Chief Complaint:  Chief Complaint  Patient presents with   Follow-up   HPI:  This is a follow-up appointment for depression, anxiety.  She states that she is not feeling well.  She feels upset about BP upon checking in. She is very concerned about her health.  She has not seen PCP for the past few years as she has been busy taking care of her mother.  She thinks her mother has been doing worse.  She does not remember her husband, and he can get upset at times.  She agrees to find out resources including home health.  She feels guilty as she is not doing good enough. Her brothers work, and she is the only one who can help her.  Introduced the idea of self compassion, and caregiver burnout.  She is planning to go to [place] with her husband to see her grandchild, who she has not been able to see for a while.  She states that she now has ruminating thoughts that clonazepam will be discontinued. She was reassured that the medication will be continued for now. The patient has mood symptoms as in PHQ-9/GAD-7. She has insomnia. She denies SI.  She is willing to try an intervention as outlined below.    Support: husband Household: husband Office manager), mother with dementia Marital status: married since 1994/08/11, married twice (her ex-husband died in 08/12/19) Number of children: 3 children, 4 grandchildren Employment:  Education:  high school, and went to [employer] She grew up in [place].  Her father was alcoholic.  Although he was great when he is sober, he was hateful when he drinks.  He died from pancreatic cancer in 08/12/2011.  Her brother has issues with substance use.   Wt Readings from Last 3 Encounters:  08/01/23 234 lb (106.1 kg)  04/26/23 221 lb 6.4 oz (100.4 kg)  10/19/18 235 lb 12.8 oz (107 kg)      Substance use   Tobacco Alcohol Other substances/  Current denies denies denies  Past Not since 08/11/13 denies  denies  Past Treatment            Support: husband Household: husband Office manager), mother with dementia Marital status: married since 08-11-94, married twice (her ex-husband died in Aug 12, 2019) Number of children: 3 children, 4 grandchildren Employment:  Education:  high school, and went to [employer] She grew up in [place].  Her father was alcoholic.  Although he was great when he is sober, he was hateful when he drinks.  He died from pancreatic cancer in 12-Aug-2011.  Her brother has issues with substance use.   Visit Diagnosis:    ICD-10-CM   1. MDD (major depressive disorder), recurrent, in full remission (HCC)  F33.42     2. Anxiety disorder, unspecified type  F41.9     3. Obsessive-compulsive disorder, unspecified type  F42.9     4. Benzodiazepine dependence (HCC)  F13.20       Past Psychiatric History: Please see initial evaluation for full details. I have reviewed the history. No updates at this time.     Past Medical History:  Past Medical History:  Diagnosis Date   Bipolar disorder (HCC)    Depression    History reviewed. No pertinent surgical history.  Family Psychiatric History: Please see initial evaluation for full details. I have reviewed the history. No updates at this time.    Family History:  Family History  Problem Relation Age of Onset   Cancer Mother    Atrial fibrillation Mother    Alcohol abuse Father    Cancer Father    Anxiety disorder Father    Depression Father    Hypertension Brother    Alcohol abuse Brother    Heart disease Brother    Suicidality Paternal Uncle    Alcohol abuse Paternal Grandfather    Suicidality Paternal Grandfather    Post-traumatic stress disorder Paternal Grandfather     Social History:  Social History   Socioeconomic History   Marital status: Married    Spouse name: dennis   Number of children: 2   Years of education: Not on file   Highest education level: High school graduate  Occupational History   Not on file   Tobacco Use   Smoking status: Former    Current packs/day: 0.00    Types: Cigarettes    Start date: 07/15/1983    Quit date: 07/14/2013    Years since quitting: 10.0   Smokeless tobacco: Never  Vaping Use   Vaping status: Never Used  Substance and Sexual Activity   Alcohol use: No    Alcohol/week: 0.0 standard drinks of alcohol   Drug use: No   Sexual activity: Not Currently  Other Topics Concern   Not on file  Social History Narrative   Not on file   Social Determinants of Health   Financial Resource Strain: Not on file  Food Insecurity: Not on file  Transportation Needs: Not on file  Physical Activity: Not on file  Stress: Not on file  Social Connections: Not on file    Allergies:  Allergies  Allergen Reactions   Lithium Other (See Comments)    Metabolic Disorder Labs: No results found for: "HGBA1C", "MPG" No results found for: "PROLACTIN" Lab Results  Component Value Date   CHOL 337 (H) 01/24/2013   TRIG 140 01/24/2013   HDL 42 01/24/2013   VLDL 28 01/24/2013   LDLCALC 267 (H) 01/24/2013   LDLCALC 184 (H) 01/06/2012   Lab Results  Component Value Date   TSH 1.09 05/23/2014   TSH 0.97 05/14/2013    Therapeutic Level Labs: No results found for: "LITHIUM" No results found for: "VALPROATE" No results found for: "CBMZ"  Current Medications: Current Outpatient Medications  Medication Sig Dispense Refill   busPIRone (BUSPAR) 5 MG tablet Take 1 tablet (5 mg total) by mouth 2 (two) times daily. 60 tablet 1   clonazePAM (KLONOPIN) 0.5 MG tablet TAKE 1 TABLET(0.5 MG) BY MOUTH THREE TIMES DAILY 90 tablet 5   pantoprazole (PROTONIX) 40 MG tablet      promethazine (PHENERGAN) 25 MG tablet Take 1 tablet (25 mg total) by mouth every 6 (six) hours as needed for nausea or vomiting. 60 tablet 5   traZODone (DESYREL) 100 MG tablet Take 1 tablet (100 mg total) by mouth at bedtime as needed for sleep. 30 tablet 1   [START ON 08/13/2023] clonazePAM (KLONOPIN) 0.25 MG  disintegrating tablet Take 1 tablet (0.25 mg total) by mouth daily as needed (anxiety). 30 tablet 1   [START ON 08/12/2023] venlafaxine XR (EFFEXOR-XR) 75 MG 24 hr capsule Take 3 capsules (225 mg total) by mouth daily. TAKE 3 CAPSULES BY MOUTH EVERY DAY 90 capsule 1   No current facility-administered medications for this visit.     Musculoskeletal: Strength & Muscle Tone: normal Gait & Station: normal Patient leans: N/A  Psychiatric Specialty Exam: Review of Systems  Psychiatric/Behavioral:  Positive for decreased concentration, dysphoric mood and sleep disturbance. Negative for agitation, behavioral problems, confusion, hallucinations, self-injury and suicidal ideas. The patient is nervous/anxious. The patient is not hyperactive.   All other systems reviewed and are negative.   Blood pressure (!) 157/88, pulse 64, temperature 97.7 F (36.5 C), temperature source Skin, height 5' 2.6" (1.59 m), weight 234 lb (106.1 kg).Body mass index is 41.98 kg/m.  General Appearance: Well Groomed  Eye Contact:  Good  Speech:  Clear and Coherent  Volume:  Normal  Mood:  Anxious  Affect:  Appropriate, Congruent, and Restricted  Thought Process:  Coherent  Orientation:  Full (Time, Place, and Person)  Thought Content: Logical   Suicidal Thoughts:  No  Homicidal Thoughts:  No  Memory:  Immediate;   Good  Judgement:  Good  Insight:  Good  Psychomotor Activity:  Normal  Concentration:  Concentration: Good and Attention Span: Good  Recall:  Good  Fund of Knowledge: Good  Language: Good  Akathisia:  No  Handed:  Right  AIMS (if indicated): not done  Assets:  Communication Skills Desire for Improvement  ADL's:  Intact  Cognition: WNL  Sleep:  Poor   Screenings: GAD-7    Flowsheet Row Office Visit from 08/01/2023 in [facility]  Total GAD-7 Score 6      PHQ2-9    Flowsheet Row Office Visit from 08/01/2023 in [facility] Office Visit from 04/26/2023 in [facility]  PHQ-2 Total Score 2 1  PHQ-9 Total Score 7 --      Flowsheet Row Office Visit from 04/26/2023 in [facility]  C-SSRS RISK CATEGORY No Risk        Assessment and Plan:  TEERICA REITMAN is a 62 y.o. year old female with a history of depression, anxiety, GERD. The patient was transferred from Dr. Toni Amend.  1. MDD (major depressive disorder), recurrent, in full remission (HCC) 2. Anxiety disorder, unspecified type 3. Obsessive-compulsive disorder, unspecified type Acute stressors include: being a caregiver of her mother with dementia/limited help from her siblings  Other stressors include: loss of her father (emotionally abusive when he was drunk),    History: Tx from Dr. Toni Amend. Originally on venlafaxine 225 mg daily. Clonazepam 0.5 mg daily, several admission due to anxiety. Last in 2014 after overusing BC powder, although she denies it as a suicide attempt . Significant suicide history on her paternal side of family  There has been worsening in anxiety, depressive symptoms in the context of stressors as above, and tapering down clonazepam.  Will add BuSpar to target anxiety.  Will continue venlafaxine to target depression, OCD.  Will continue current dose of clonazepam as needed for anxiety at this time with the hope to discontinue in the future.   4. Benzodiazepine dependence (HCC) She had significant worsening in anxiety in the context of tapering down clonazepam, although she was previously willing to taper it off.  Will plan on to stay on the current dose at this time.    # Hypertension She has marked hypertension on today's evaluation, and reports mild dizziness.  She was advised to go to urgent care for further  intervention.  She agrees with the plan.   Plan Continue venlafaxine 225 mg daily Start Buspar 5 mg twice a day  Continue  clonazepam 0.25 mg daily as needed (reduced 04/2023) Establish care with primary care/she was advised to go to urgent  care/[facility] walk in clinic Obtain TSH to rule out medical health issues contributing to this. Next appointment- 1/13 10 AM   The patient demonstrates the following risk factors for suicide: Chronic risk factors for suicide include: psychiatric disorder of depression . Acute risk factors for suicide include: N/A. Protective factors for this patient include: responsibility to others (children, family), coping skills, and hope for the future. Considering these factors, the overall suicide risk at this point appears to be low. Patient is appropriate for outpatient follow up.   Collaboration of Care: Collaboration of Care: Other reviewed notes in Epic  Patient/Guardian was advised Release of Information must be obtained prior to any record release in order to collaborate their care with an outside provider. Patient/Guardian was advised if they have not already done so to contact the registration department to sign all necessary forms in order for [place] to release information regarding their care.   Consent: Patient/Guardian gives verbal consent for treatment and assignment of benefits for services provided during this visit. Patient/Guardian expressed understanding and agreed to proceed.    Neysa Hotter, MD 08/01/2023, 9:12 AM

## 2023-08-01 ENCOUNTER — Encounter: Payer: Self-pay | Admitting: Psychiatry

## 2023-08-01 ENCOUNTER — Ambulatory Visit (INDEPENDENT_AMBULATORY_CARE_PROVIDER_SITE_OTHER): Payer: Medicare Other | Admitting: Psychiatry

## 2023-08-01 VITALS — BP 157/88 | HR 64 | Temp 97.7°F | Ht 62.6 in | Wt 234.0 lb

## 2023-08-01 DIAGNOSIS — F429 Obsessive-compulsive disorder, unspecified: Secondary | ICD-10-CM | POA: Diagnosis not present

## 2023-08-01 DIAGNOSIS — F3342 Major depressive disorder, recurrent, in full remission: Secondary | ICD-10-CM | POA: Diagnosis not present

## 2023-08-01 DIAGNOSIS — F419 Anxiety disorder, unspecified: Secondary | ICD-10-CM

## 2023-08-01 DIAGNOSIS — F1320 Sedative, hypnotic or anxiolytic dependence, uncomplicated: Secondary | ICD-10-CM

## 2023-08-01 MED ORDER — BUSPIRONE HCL 5 MG PO TABS: 5.0000 mg | ORAL_TABLET | Freq: Two times a day (BID) | ORAL | 1 refills | Status: DC

## 2023-08-01 MED ORDER — CLONAZEPAM 0.25 MG PO TBDP: 0.2500 mg | ORAL_TABLET | Freq: Every day | ORAL | 1 refills | Status: DC | PRN

## 2023-08-01 MED ORDER — VENLAFAXINE HCL ER 75 MG PO CP24: 225.0000 mg | ORAL_CAPSULE | Freq: Every day | ORAL | 1 refills | Status: DC

## 2023-08-01 NOTE — Patient Instructions (Signed)
Continue venlafaxine 225 mg daily Start buspar 5 mg twice a day  Continue clonazepam 0.25 mg daily as needed  Establish care with primary care Obtain lab/TSH  Next appointment- 1/13 10 AM

## 2023-08-17 ENCOUNTER — Other Ambulatory Visit: Payer: Self-pay | Admitting: Psychiatry

## 2023-09-19 NOTE — Progress Notes (Signed)
 BH MD/PA/NP OP Progress Note  09/26/2023 10:33 AM Shirley [name]  MRN:  [id number]  Chief Complaint:  Chief Complaint  Patient presents with   Follow-up   HPI:  This is a follow-up appointment for depression, anxiety and insomnia.  She states that she is just back from [place] .  She is not feeling anxious as much.  She had a good time with her 2 children and grandchildren who lives there.  Her brother helps her to keep her mother during that time.  She states that she has been with her mother 24/7, and she feels irritable.  Her mother has been grumpy all the time, and she does not want to go outside, although she will do so with her brother.  She does not have her own time, stating that she cannot even take a shower.  The patient has mood symptoms as in PHQ-9/GAD-7. Her sleep has been fair except that she wakes up in the morning for no reason.  She has been taking BuSpar  once a day and clonazepam ; she thinks it has been going well, and agrees that she feels less anxious compared to before.  She denies SI. She agrees with the plan as outlined below. .  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 .  Her father was alcoholic.  Although he was great when he is sober, he was hateful when he drinks.  He died from pancreatic cancer in 2011/08/26. She reports experiencing conflict with her mother while growing up and believes her brother, who  struggled with substance use, was the favorite child. She recalls being blamed for things she did not do or for actions her father had taken.  Visit Diagnosis:    ICD-10-CM   1. MDD (major depressive disorder), recurrent, in full remission (HCC)  F33.42     2. Anxiety disorder, unspecified type  F41.9     3. Obsessive-compulsive disorder, unspecified type  F42.9     4. Insomnia, unspecified type  G47.00       Past Psychiatric History: Please see initial evaluation for full details. I have reviewed the history. No updates at this time.     Past Medical History:  Past Medical History:  Diagnosis Date   Bipolar disorder (HCC)    Depression    History reviewed. No pertinent surgical history.  Family Psychiatric History: Please see initial evaluation for full details. I have reviewed the history. No updates at this time.     Family History:  Family History  Problem Relation Age of Onset   Cancer Mother    Atrial fibrillation Mother    Alcohol abuse Father    Cancer Father    Anxiety disorder Father    Depression Father    Hypertension Brother    Alcohol abuse Brother    Heart disease Brother    Suicidality Paternal Uncle  Alcohol abuse Paternal Grandfather    Suicidality Paternal Grandfather    Post-traumatic stress disorder Paternal Grandfather     Social History:  Social History   Socioeconomic History   Marital status: Married    Spouse name: Shirley [name]   Number of children: 2   Years of education: Not on file   Highest education level: High school graduate  Occupational History   Not on file  Tobacco Use   Smoking status: Former    Current packs/day: 0.00    Types: Cigarettes    Start date: 07/15/1983    Quit date: 07/14/2013    Years since quitting: 10.2   Smokeless tobacco: Never  Vaping Use   Vaping status: Never Used  Substance and Sexual Activity   Alcohol use: No    Alcohol/week: 0.0 standard drinks of alcohol   Drug use: No   Sexual activity:  Not Currently  Other Topics Concern   Not on file  Social History Narrative   Not on file   Social Drivers of Health   Financial Resource Strain: Not on file  Food Insecurity: Not on file  Transportation Needs: Not on file  Physical Activity: Not on file  Stress: Not on file  Social Connections: Not on file    Allergies:  Allergies  Allergen Reactions   Lithium  Other (See Comments)    Metabolic Disorder Labs: No results found for: HGBA1C, MPG No results found for: PROLACTIN Lab Results  Component Value Date   CHOL 337 (H) 01/24/2013   TRIG 140 01/24/2013   HDL 42 01/24/2013   VLDL 28 01/24/2013   LDLCALC 267 (H) 01/24/2013   LDLCALC 184 (H) 01/06/2012   Lab Results  Component Value Date   TSH 1.09 05/23/2014   TSH 0.97 05/14/2013    Therapeutic Level Labs: No results found for: LITHIUM  No results found for: VALPROATE No results found for: CBMZ  Current Medications: Current Outpatient Medications  Medication Sig Dispense Refill   busPIRone  (BUSPAR ) 5 MG tablet Take 1 tablet (5 mg total) by mouth 2 (two) times daily. 60 tablet 1   clonazePAM  (KLONOPIN ) 0.25 MG disintegrating tablet Take 1 tablet (0.25 mg total) by mouth daily as needed (anxiety). 30 tablet 1   promethazine  (PHENERGAN ) 25 MG tablet Take 1 tablet (25 mg total) by mouth every 6 (six) hours as needed for nausea or vomiting. 60 tablet 5   venlafaxine  XR (EFFEXOR -XR) 75 MG 24 hr capsule Take 3 capsules (225 mg total) by mouth daily. TAKE 3 CAPSULES BY MOUTH EVERY DAY 90 capsule 1   traZODone  (DESYREL ) 100 MG tablet Take 1 tablet (100 mg total) by mouth at bedtime as needed for sleep. 30 tablet 1   No current facility-administered medications for this visit.     Musculoskeletal: Strength & Muscle Tone: within normal limits Gait & Station: normal Patient leans: N/A  Psychiatric Specialty Exam: Review of Systems  Psychiatric/Behavioral:  Positive for sleep disturbance. Negative for  agitation, behavioral problems, confusion, decreased concentration, dysphoric mood, hallucinations, self-injury and suicidal ideas. The patient is nervous/anxious. The patient is not hyperactive.   All other systems reviewed and are negative.   Blood pressure (!) 148/88, pulse 83, temperature 98.3 F (36.8 C), temperature source Temporal, height 5' 2.6 (1.59 m), weight 236 lb 6.4 oz (107.2 kg), SpO2 98%.Body mass index is 42.41 kg/m.  General Appearance: Well Groomed  Eye Contact:  Good  Speech:  Clear and Coherent  Volume:  Normal  Mood:   better  Affect:  Appropriate, Congruent, and Full Range  Thought Process:  Coherent  Orientation:  Full (Time, Place, and Person)  Thought Content: Logical   Suicidal Thoughts:  No  Homicidal Thoughts:  No  Memory:  Immediate;   Good  Judgement:  Good  Insight:  Good  Psychomotor Activity:  Normal  Concentration:  Concentration: Good and Attention Span: Good  Recall:  Good  Fund of Knowledge: Good  Language: Good  Akathisia:  No  Handed:  Right  AIMS (if indicated): not done  Assets:  Communication Skills Desire for Improvement  ADL's:  Intact  Cognition: WNL  Sleep:  Fair   Screenings: GAD-7    Garment/textile Technologist Visit from 09/26/2023 in [facility] Office Visit from 08/01/2023 in [facility]  Total GAD-7 Score 4 6      PHQ2-9    Flowsheet Row Office Visit from 09/26/2023 in [facility] Office Visit from 08/01/2023 in [facility] Office Visit from 04/26/2023 in [facility]  PHQ-2 Total Score 3 2 1   PHQ-9 Total Score 9 7 --      Flowsheet Row Office Visit from 04/26/2023 in [facility]  C-SSRS RISK CATEGORY No Risk        Assessment and Plan:  LINDSY CERULLO is a 63 y.o. year old  female with a history of depression, anxiety, GERD. The patient was transferred from Dr. Clapacs.  1. MDD (major depressive disorder), recurrent, in full remission (HCC) 2. Anxiety disorder, unspecified type 3. Obsessive-compulsive disorder, unspecified type Acute stressors include: being a caregiver of her mother with dementia/limited help from her siblings  Other stressors include: loss of her father (emotionally abusive when he was drunk),    History: Tx from Dr. Clapacs. Originally on venlafaxine  225 mg daily. Clonazepam  0.5 mg daily, several admission due to anxiety. Last in 2014 after overusing BC powder, although she denies it as a suicide attempt . Significant suicide history on her paternal side of family  That has been overall improvement in anxiety, which coincided with starting buspar , and recent visit to [place]  to see her children and grandchildren.  We uptitrate BuSpar  to optimize treatment for anxiety given she has been taking only once a day.  Will continue venlafaxine  to target depression and OCD.  Will continue clonazepam  as needed for now with the hope to taper it off in the future.   4. Insomnia, unspecified type Overall stable.  Will continue trazodone  as needed for insomnia.    4. Benzodiazepine dependence (HCC) - reduced 04/2023 She has been tolerating well to tapering down clonazepam  despite initial challenge.  Will continue to stay on the current dose at this time.   # Hypertension Improving.  She was advised again to establish care with primary care for further evaluation and treatment.   Plan Continue venlafaxine  225 mg daily Increase Buspar  5 mg twice a day  Continue clonazepam  0.25 mg daily as needed - refills left Continue trazodone  100 mg at night as needed for insomnia Obtain TSH to rule out medical health issues contributing to this. Next appointment- 2/25 at 10:30, IP - she was recommended to establish care with PCP for evaluation of hypertension   The  patient demonstrates the following risk factors for suicide: Chronic risk factors for suicide include: psychiatric disorder of depression . Acute risk factors for suicide include: N/A. Protective factors  for this patient include: responsibility to others (children, family), coping skills, and hope for the future. Considering these factors, the overall suicide risk at this point appears to be low. Patient is appropriate for outpatient follow up.   Collaboration of Care: Collaboration of Care: Other reviewed notes in Epic  Patient/Guardian was advised Release of Information must be obtained prior to any record release in order to collaborate their care with an outside provider. Patient/Guardian was advised if they have not already done so to contact the registration department to sign all necessary forms in order for us  to release information regarding their care.   Consent: Patient/Guardian gives verbal consent for treatment and assignment of benefits for services provided during this visit. Patient/Guardian expressed understanding and agreed to proceed.    Katheren Sleet, MD 09/26/2023, 10:33 AM

## 2023-09-26 ENCOUNTER — Ambulatory Visit (INDEPENDENT_AMBULATORY_CARE_PROVIDER_SITE_OTHER): Payer: Medicare Other | Admitting: Psychiatry

## 2023-09-26 ENCOUNTER — Encounter: Payer: Self-pay | Admitting: Psychiatry

## 2023-09-26 VITALS — BP 148/88 | HR 83 | Temp 98.3°F | Ht 62.6 in | Wt 236.4 lb

## 2023-09-26 DIAGNOSIS — F419 Anxiety disorder, unspecified: Secondary | ICD-10-CM

## 2023-09-26 DIAGNOSIS — G4700 Insomnia, unspecified: Secondary | ICD-10-CM | POA: Diagnosis not present

## 2023-09-26 DIAGNOSIS — F3342 Major depressive disorder, recurrent, in full remission: Secondary | ICD-10-CM

## 2023-09-26 DIAGNOSIS — F4001 Agoraphobia with panic disorder: Secondary | ICD-10-CM

## 2023-09-26 DIAGNOSIS — F429 Obsessive-compulsive disorder, unspecified: Secondary | ICD-10-CM

## 2023-09-26 MED ORDER — TRAZODONE HCL 100 MG PO TABS: 100.0000 mg | ORAL_TABLET | Freq: Every evening | ORAL | 1 refills | Status: DC | PRN

## 2023-09-26 MED ORDER — VENLAFAXINE HCL ER 75 MG PO CP24: 225.0000 mg | ORAL_CAPSULE | Freq: Every day | ORAL | 3 refills | Status: DC

## 2023-09-26 NOTE — Patient Instructions (Signed)
 Continue venlafaxine 225 mg daily Increase Buspar 5 mg twice a day  Continue clonazepam 0.25 mg daily as needed  Continue trazodone 100 mg at night as needed for insomnia Obtain TSH at [facility] Next appointment- 2/25 at 10:30, IP

## 2023-09-27 ENCOUNTER — Other Ambulatory Visit: Payer: Self-pay | Admitting: Psychiatry

## 2023-11-05 NOTE — Progress Notes (Deleted)
 BH MD/PA/NP OP Progress Note  11/05/2023 8:36 AM Shirley [name]  MRN:  [id number]  Chief Complaint: No chief complaint on file.  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  Her father was alcoholic.  Although he was great when he is sober, he was hateful when he drinks.  He died from pancreatic cancer in 11/30/10. She reports experiencing conflict with her mother while growing up and believes her brother, who struggled with substance use, was the favorite child. She recalls being blamed for things she did not do or for actions her father had taken.    Visit Diagnosis: No diagnosis found.  Past Psychiatric History: Please see initial evaluation for full details. I have reviewed the history. No updates at this time.     Past Medical History:  Past Medical History:  Diagnosis Date   Bipolar disorder (HCC)    Depression    No past surgical history on file.  Family Psychiatric History: Please see initial evaluation for full details. I have reviewed the history. No updates at this time.     Family History:  Family History  Problem Relation Age of Onset   Cancer Mother    Atrial fibrillation Mother    Alcohol abuse Father    Cancer Father    Anxiety disorder Father    Depression Father    Hypertension Brother    Alcohol abuse Brother    Heart disease Brother    Suicidality Paternal Uncle    Alcohol abuse Paternal Grandfather    Suicidality Paternal Grandfather    Post-traumatic stress disorder Paternal Grandfather     Social History:  Social  History   Socioeconomic History   Marital status: Married    Spouse name: dennis   Number of children: 2   Years of education: Not on file   Highest education level: High school graduate  Occupational History   Not on file  Tobacco Use   Smoking status: Former    Current packs/day: 0.00    Types: Cigarettes    Start date: 07/15/1983    Quit date: 07/14/2013    Years since quitting: 10.3   Smokeless tobacco: Never  Vaping Use   Vaping status: Never Used  Substance and Sexual Activity   Alcohol use: No    Alcohol/week: 0.0 standard drinks of alcohol   Drug use: No   Sexual activity: Not Currently  Other Topics Concern   Not on file  Social History Narrative   Not on file   Social Drivers of Health   Financial Resource Strain: Not on file  Food Insecurity: Not on file  Transportation Needs: Not on file  Physical Activity: Not on file  Stress: Not on file  Social Connections: Not on file    Allergies:  Allergies  Allergen Reactions   Lithium Other (See Comments)    Metabolic Disorder Labs: No results found for: "HGBA1C", "MPG" No results found for: "PROLACTIN" Lab Results  Component Value Date   CHOL 337 (H) 01/24/2013   TRIG 140 01/24/2013  HDL 42 01/24/2013   VLDL 28 01/24/2013   LDLCALC 267 (H) 01/24/2013   LDLCALC 184 (H) 01/06/2012   Lab Results  Component Value Date   TSH 1.09 05/23/2014   TSH 0.97 05/14/2013    Therapeutic Level Labs: No results found for: "LITHIUM" No results found for: "VALPROATE" No results found for: "CBMZ"  Current Medications: Current Outpatient Medications  Medication Sig Dispense Refill   busPIRone (BUSPAR) 5 MG tablet Take 1 tablet (5 mg total) by mouth 2 (two) times daily. 60 tablet 1   clonazePAM (KLONOPIN) 0.25 MG disintegrating tablet Take 1 tablet (0.25 mg total) by mouth daily as needed (anxiety). 30 tablet 1   promethazine (PHENERGAN) 25 MG tablet Take 1 tablet (25 mg total) by mouth every 6 (six) hours as  needed for nausea or vomiting. 60 tablet 5   traZODone (DESYREL) 100 MG tablet Take 1 tablet (100 mg total) by mouth at bedtime as needed for sleep. 30 tablet 1   venlafaxine XR (EFFEXOR-XR) 75 MG 24 hr capsule Take 3 capsules (225 mg total) by mouth daily. TAKE 3 CAPSULES BY MOUTH EVERY DAY 90 capsule 3   No current facility-administered medications for this visit.     Musculoskeletal: Strength & Muscle Tone: within normal limits Gait & Station: normal Patient leans: N/A  Psychiatric Specialty Exam: Review of Systems  There were no vitals taken for this visit.There is no height or weight on file to calculate BMI.  General Appearance: {Appearance:22683}  Eye Contact:  {BHH EYE CONTACT:22684}  Speech:  Clear and Coherent  Volume:  Normal  Mood:  {BHH MOOD:22306}  Affect:  {Affect (PAA):22687}  Thought Process:  Coherent  Orientation:  Full (Time, Place, and Person)  Thought Content: Logical   Suicidal Thoughts:  {ST/HT (PAA):22692}  Homicidal Thoughts:  {ST/HT (PAA):22692}  Memory:  Immediate;   Good  Judgement:  {Judgement (PAA):22694}  Insight:  {Insight (PAA):22695}  Psychomotor Activity:  Normal  Concentration:  Concentration: Good and Attention Span: Good  Recall:  Good  Fund of Knowledge: Good  Language: Good  Akathisia:  No  Handed:  Right  AIMS (if indicated): not done  Assets:  Communication Skills Desire for Improvement  ADL's:  Intact  Cognition: WNL  Sleep:  {BHH GOOD/FAIR/POOR:22877}   Screenings: GAD-7    Loss adjuster, chartered Office Visit from 09/26/2023 in [facility] Office Visit from 08/01/2023 in [facility]  Total GAD-7 Score 4 6      PHQ2-9    Flowsheet Row Office Visit from 09/26/2023 in [facility] Office Visit from 08/01/2023 in [facility] Office Visit from 04/26/2023 in [facility]  PHQ-2 Total Score 3 2 1   PHQ-9 Total Score 9 7 --      Flowsheet Row Office Visit from 04/26/2023 in [facility]  C-SSRS RISK CATEGORY No Risk        Assessment and Plan:  ASMAA TIRPAK is a 63 y.o. year old female with a history of depression, anxiety, GERD. The patient was transferred from Dr. Toni Amend.   1. MDD (major depressive disorder), recurrent, in full remission (HCC) 2. Anxiety disorder, unspecified type 3. Obsessive-compulsive disorder, unspecified type Acute stressors include: being a caregiver of her mother with dementia/limited help from her siblings  Other stressors include: loss of her father (emotionally abusive when he was drunk),    History: Tx  from Dr. Toni Amend. Originally on venlafaxine 225 mg daily. Clonazepam 0.5 mg daily, several admission due to anxiety. Last in 2014 after overusing BC powder, although she denies it as a suicide attempt . Significant suicide history on her paternal side of family  That has been overall improvement in anxiety, which coincided with starting buspar, and recent visit to [place] to see her children and grandchildren.  We uptitrate BuSpar to optimize treatment for anxiety given she has been taking only once a day.  Will continue venlafaxine to target depression and OCD.  Will continue clonazepam as needed for now with the hope to taper it off in the future.    4. Insomnia, unspecified type Overall stable.  Will continue trazodone as needed for insomnia.    4. Benzodiazepine dependence (HCC) - reduced 04/2023 She has been tolerating well to tapering down clonazepam despite initial challenge.  Will continue to stay on the current dose at this time.    # Hypertension Improving.  She was advised again to establish care with primary care for further evaluation and treatment.    Plan Continue venlafaxine 225 mg daily Increase Buspar 5 mg twice a day   Continue clonazepam 0.25 mg daily as needed - refills left Continue trazodone 100 mg at night as needed for insomnia Obtain TSH to rule out medical health issues contributing to this. Next appointment- 2/25 at 10:30, IP - she was recommended to establish care with PCP for evaluation of hypertension    The patient demonstrates the following risk factors for suicide: Chronic risk factors for suicide include: psychiatric disorder of depression . Acute risk factors for suicide include: N/A. Protective factors for this patient include: responsibility to others (children, family), coping skills, and hope for the future. Considering these factors, the overall suicide risk at this point appears to be low. Patient is appropriate for outpatient follow up.   Collaboration of Care: Collaboration of Care: {BH OP Collaboration of Care:21014065}  Patient/Guardian was advised Release of Information must be obtained prior to any record release in order to collaborate their care with an outside provider. Patient/Guardian was advised if they have not already done so to contact the registration department to sign all necessary forms in order for [place] to release information regarding their care.   Consent: Patient/Guardian gives verbal consent for treatment and assignment of benefits for services provided during this visit. Patient/Guardian expressed understanding and agreed to proceed.    Neysa Hotter, MD 11/05/2023, 8:36 AM

## 2023-11-08 ENCOUNTER — Ambulatory Visit: Payer: Medicare Other | Admitting: Psychiatry

## 2023-12-24 NOTE — Progress Notes (Unsigned)
 BH MD/PA/NP OP Progress Note  12/29/2023 9:19 AM Shirley [name]  MRN:  [id number]  Chief Complaint:  Chief Complaint  Patient presents with   Follow-up   HPI:  This is a follow-up appointment for depression, anxiety, OCD and insomnia.  She expresses ongoing frustration toward her mother, who has dementia. Her mother tends to forget, and repeat things over and over. This goes 24/7.  Although she does not like the way she responds, she cannot help but hollowing.  Her brother does not do anything.  Her mother is mad all the time, and she also has some sundowning episode at night.  She states that they have been this way even prior to have dementia ("oil and water).  Although she wishes that her mother might forget about their previous relationship, it has been still this way.  Although nursing aide comes twice a week, she does not have other support.  Her husband takes good care of her, although he is out of town for 3 months otherwise.  She also states that her daughter becomes pregnant, and is due in November.  She is concerned as her daughter is in [place] .  She has insomnia due to her mother's behavior, although she cannot sleep well otherwise.  She reports slight decrease in appetite.  She feels depressed.  She denies SI.  She has intense anxiety, although she denies panic attacks.  She does not feel ready to discontinue clonazepam, although she is willing to hold this medication whenever she can.  She thinks BuSpar has been helping for anxiety.     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  Her father was alcoholic.  Although he was great when he is sober, he was hateful when he drinks.  He died from pancreatic cancer in Jan 18, 2011. She reports experiencing conflict with ("oil and water) while growing up and believes her brother, who struggled with substance use, was the favorite child. She recalls being blamed for things she did not do or for actions her father had taken.  Visit Diagnosis:    ICD-10-CM   1. MDD (major depressive disorder), recurrent episode, mild (HCC)  F33.0     2. Anxiety disorder, unspecified type  F41.9     3. Obsessive-compulsive disorder, unspecified type  F42.9     4. Panic disorder with agoraphobia  F40.01 traZODone (DESYREL) 100 MG tablet    5. Insomnia, unspecified type  G47.00       Past Psychiatric History: Please see initial evaluation for full details. I have reviewed the history. No updates at this time.     Past Medical History:  Past Medical History:  Diagnosis Date   Bipolar disorder (HCC)    Depression    History reviewed. No pertinent surgical history.  Family Psychiatric History: Please see initial evaluation for full details.  I have reviewed the history. No updates at this time.     Family History:  Family History  Problem Relation Age of Onset   Cancer Mother    Atrial fibrillation Mother    Alcohol abuse Father    Cancer Father    Anxiety disorder Father    Depression Father    Hypertension Brother    Alcohol abuse Brother    Heart disease Brother    Suicidality Paternal Uncle    Alcohol abuse Paternal Grandfather    Suicidality Paternal Grandfather    Post-traumatic stress disorder Paternal Grandfather     Social History:  Social History   Socioeconomic History   Marital status: Married    Spouse name: dennis    Number of children: 2   Years of education: Not on file   Highest education level: High school graduate  Occupational History   Not on file  Tobacco Use   Smoking status: Former    Current packs/day: 0.00    Types: Cigarettes    Start date: 07/15/1983    Quit date: 07/14/2013    Years since quitting: 10.4   Smokeless tobacco: Never  Vaping Use   Vaping status: Never Used  Substance and Sexual Activity   Alcohol use: No    Alcohol/week: 0.0 standard drinks of alcohol   Drug use: No   Sexual activity: Not Currently  Other Topics Concern   Not on file  Social History Narrative   Not on file   Social Drivers of Health   Financial Resource Strain: Not on file  Food Insecurity: Not on file  Transportation Needs: Not on file  Physical Activity: Not on file  Stress: Not on file  Social Connections: Not on file    Allergies:  Allergies  Allergen Reactions   Lithium Other (See Comments)    Metabolic Disorder Labs: No results found for: "HGBA1C", "MPG" No results found for: "PROLACTIN" Lab Results  Component Value Date   CHOL 337 (H) 01/24/2013   TRIG 140 01/24/2013   HDL 42 01/24/2013   VLDL 28 01/24/2013   LDLCALC 267 (H) 01/24/2013   LDLCALC 184 (H) 01/06/2012   Lab Results  Component Value Date   TSH 1.011 12/27/2023   TSH 1.09 05/23/2014    Therapeutic Level Labs: No results found for: "LITHIUM" No results found for: "VALPROATE" No results found for: "CBMZ"  Current Medications: Current Outpatient Medications  Medication Sig Dispense Refill   busPIRone (BUSPAR) 5 MG tablet Take 5 mg by mouth 2 (two) times daily.     busPIRone (BUSPAR) 7.5 MG tablet Take 1 tablet (7.5 mg total) by mouth 2 (two) times daily. 60 tablet 1   promethazine (PHENERGAN) 25 MG tablet Take 1 tablet (25 mg total) by mouth every 6 (six) hours as needed for nausea or vomiting. 60 tablet 5   venlafaxine XR (EFFEXOR-XR) 75 MG 24 hr capsule Take 3 capsules (225 mg total) by mouth daily.  TAKE 3 CAPSULES BY MOUTH EVERY DAY 90 capsule 3   clonazePAM (KLONOPIN) 0.25 MG disintegrating tablet Take 1 tablet (0.25 mg total) by mouth daily as needed (anxiety). 30 tablet 1   traZODone (DESYREL) 100 MG tablet Take 1 tablet (100 mg total) by mouth at bedtime as needed for sleep. 30 tablet 3   No current facility-administered medications for this visit.     Musculoskeletal: Strength & Muscle Tone: within normal limits Gait & Station: normal Patient leans: N/A  Psychiatric Specialty Exam: Review of Systems  Psychiatric/Behavioral:  Positive for dysphoric mood and sleep disturbance. Negative for agitation, behavioral problems, confusion, decreased concentration, hallucinations, self-injury and suicidal ideas. The patient is nervous/anxious. The patient is not hyperactive.   All other systems reviewed and are negative.   Blood pressure 128/80, pulse 87, temperature 98.5 F (36.9 C), temperature source Temporal, height 5' 2.6" (1.59 m), weight 235 lb 6.4 oz (106.8 kg), SpO2 97%.Body mass index is 42.23 kg/m.  General Appearance: Well Groomed  Eye Contact:  Good  Speech:  Clear and Coherent  Volume:  Normal  Mood:  Anxious  Affect:  Appropriate, Congruent, and calm  Thought Process:  Coherent  Orientation:  Full (Time, Place, and Person)  Thought Content: Logical   Suicidal Thoughts:  No  Homicidal Thoughts:  No  Memory:  Immediate;   Good  Judgement:  Good  Insight:  Good  Psychomotor Activity:  Normal  Concentration:  Concentration: Good and Attention Span: Good  Recall:  Good  Fund of Knowledge: Good  Language: Good  Akathisia:  No  Handed:  Right  AIMS (if indicated): not done  Assets:  Communication Skills Desire for Improvement  ADL's:  Intact  Cognition: WNL  Sleep:  Poor   Screenings: GAD-7    Flowsheet Row Office Visit from 09/26/2023 in [facility] Office Visit from 08/01/2023 in [facility]  Total GAD-7 Score 4 6      PHQ2-9    Flowsheet Row Office Visit from 09/26/2023 in [facility] Office Visit from 08/01/2023 in [facility] Office Visit from 04/26/2023 in [facility]  PHQ-2 Total Score 3 2 1   PHQ-9 Total Score 9 7 --      Flowsheet Row Office Visit from 04/26/2023 in [facility]  C-SSRS RISK CATEGORY No Risk        Assessment and Plan:  NATALLIA STELLMACH is a 63 y.o. year old female with a history of depression, anxiety, GERD. The patient was transferred from Dr. Clapacs.   1. MDD (major depressive disorder), recurrent, mild (HCC) 2. Anxiety disorder, unspecified type 3. Obsessive-compulsive disorder, unspecified type Acute stressors include: being a caregiver of her mother with dementia/limited help from her siblings  Other stressors include: loss of her father (emotionally abusive when he was drunk),    History: Tx from Dr. Clapacs. Originally on venlafaxine 225 mg daily. Clonazepam 0.5 mg daily, several admission due to anxiety. Last in 2014 after overusing BC powder, although she denies it as a suicide attempt . Significant suicide history on her paternal side of family   She reports worsening in depressive symptoms and anxiety in the context of taking care of her mother with dementia, while she has limited support from her family, except her husband, who works out of state.  Will uptitrate this further optimize treatment for anxiety given she has good benefit from this medication.  Will continue venlafaxine to target depression and OCD.  Will continue clonazepam as needed for anxiety.   # insomnia Although she continues to experience insomnia, this is primarily related to her mother's sundowning episodes.  Will continue trazodone as needed for insomnia.    4. Benzodiazepine  dependence (HCC) - reduced 04/2023 She has been tolerating well to tapering down clonazepam despite initial challenge.  Will continue to stay on the current dose at this time. Will plan to obtain lab if this medication  were to be continued after the following visit.     Plan Continue venlafaxine 225 mg daily Increase Buspar 7.5 mg twice a day  Continue clonazepam 0.25 mg daily as needed - refills left Continue trazodone 100 mg at night as needed for insomnia Next appointment- 6/2 at 8 am, IP - she was recommended to establish care with PCP for evaluation of hypertension    The patient demonstrates the following risk factors for suicide: Chronic risk factors for suicide include: psychiatric disorder of depression . Acute risk factors for suicide include: N/A. Protective factors for this patient include: responsibility to others (children, family), coping skills, and hope for the future. Considering these factors, the overall suicide risk at this point appears to be low. Patient is appropriate for outpatient follow up.   Collaboration of Care: Collaboration of Care: Other reviewed notes in Epic  Patient/Guardian was advised Release of Information must be obtained prior to any record release in order to collaborate their care with an outside provider. Patient/Guardian was advised if they have not already done so to contact the registration department to sign all necessary forms in order for us  to release information regarding their care.   Consent: Patient/Guardian gives verbal consent for treatment and assignment of benefits for services provided during this visit. Patient/Guardian expressed understanding and agreed to proceed.    Todd Fossa, MD 12/29/2023, 9:19 AM

## 2023-12-26 ENCOUNTER — Other Ambulatory Visit: Payer: Self-pay | Admitting: Psychiatry

## 2023-12-27 ENCOUNTER — Other Ambulatory Visit
Admission: RE | Admit: 2023-12-27 | Discharge: 2023-12-27 | Disposition: A | Discharge status: Home / Self Care | Attending: Psychiatry | Admitting: Psychiatry

## 2023-12-27 ENCOUNTER — Encounter: Payer: Self-pay | Admitting: Psychiatry

## 2023-12-27 DIAGNOSIS — F3342 Major depressive disorder, recurrent, in full remission: Secondary | ICD-10-CM | POA: Insufficient documentation

## 2023-12-27 LAB — TSH: TSH: 1.011 u[IU]/mL (ref 0.350–4.500)

## 2023-12-29 ENCOUNTER — Telehealth: Payer: Self-pay

## 2023-12-29 ENCOUNTER — Encounter: Payer: Self-pay | Admitting: Psychiatry

## 2023-12-29 ENCOUNTER — Ambulatory Visit (INDEPENDENT_AMBULATORY_CARE_PROVIDER_SITE_OTHER): Payer: Medicare Other | Admitting: Psychiatry

## 2023-12-29 VITALS — BP 128/80 | HR 87 | Temp 98.5°F | Ht 62.6 in | Wt 235.4 lb

## 2023-12-29 DIAGNOSIS — F330 Major depressive disorder, recurrent, mild: Secondary | ICD-10-CM | POA: Diagnosis not present

## 2023-12-29 DIAGNOSIS — G4700 Insomnia, unspecified: Secondary | ICD-10-CM

## 2023-12-29 DIAGNOSIS — F4001 Agoraphobia with panic disorder: Secondary | ICD-10-CM | POA: Diagnosis not present

## 2023-12-29 DIAGNOSIS — F419 Anxiety disorder, unspecified: Secondary | ICD-10-CM | POA: Diagnosis not present

## 2023-12-29 DIAGNOSIS — F429 Obsessive-compulsive disorder, unspecified: Secondary | ICD-10-CM | POA: Diagnosis not present

## 2023-12-29 MED ORDER — TRAZODONE HCL 100 MG PO TABS: 100.0000 mg | ORAL_TABLET | Freq: Every evening | ORAL | 3 refills | Status: DC | PRN

## 2023-12-29 MED ORDER — BUSPIRONE HCL 7.5 MG PO TABS: 7.5000 mg | ORAL_TABLET | Freq: Two times a day (BID) | ORAL | 1 refills | Status: DC

## 2023-12-29 MED ORDER — CLONAZEPAM 0.25 MG PO TBDP: 0.2500 mg | ORAL_TABLET | Freq: Every day | ORAL | 1 refills | Status: DC | PRN

## 2023-12-29 NOTE — Telephone Encounter (Signed)
 Please advise her to contact her primary care provider, as I do not prescribe that medication.

## 2023-12-29 NOTE — Patient Instructions (Signed)
 Continue venlafaxine 225 mg daily Increase Buspar 7.5 mg twice a day  Continue clonazepam 0.25 mg daily as needed  Continue trazodone 100 mg at night as needed for insomnia Next appointment- 6/2 at 8 am

## 2023-12-29 NOTE — Telephone Encounter (Signed)
 pharmacy was notified by fax.

## 2023-12-29 NOTE — Telephone Encounter (Signed)
 received fax requesting a refill on the promethazine. pt was last seen on 4-17 next appt 6-2. (last order by dr. Andrena Bang)

## 2024-01-25 ENCOUNTER — Other Ambulatory Visit: Payer: Self-pay | Admitting: Psychiatry
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
## 2024-02-13 ENCOUNTER — Ambulatory Visit: Admitting: Psychiatry
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
## 2024-02-19 ENCOUNTER — Other Ambulatory Visit: Payer: Self-pay | Admitting: Psychiatry

## 2024-02-23 ENCOUNTER — Ambulatory Visit: Admitting: Psychiatry

## 2024-02-23 ENCOUNTER — Encounter: Payer: Self-pay | Admitting: Psychiatry

## 2024-02-23 ENCOUNTER — Other Ambulatory Visit: Payer: Self-pay

## 2024-02-23 VITALS — BP 133/89 | HR 80 | Temp 96.8°F | Ht 62.6 in | Wt 233.0 lb

## 2024-02-23 DIAGNOSIS — F330 Major depressive disorder, recurrent, mild: Secondary | ICD-10-CM | POA: Diagnosis not present

## 2024-02-23 DIAGNOSIS — F419 Anxiety disorder, unspecified: Secondary | ICD-10-CM | POA: Diagnosis not present

## 2024-02-23 DIAGNOSIS — G4700 Insomnia, unspecified: Secondary | ICD-10-CM | POA: Diagnosis not present

## 2024-02-23 DIAGNOSIS — F429 Obsessive-compulsive disorder, unspecified: Secondary | ICD-10-CM | POA: Diagnosis not present

## 2024-02-23 MED ORDER — CLONAZEPAM 0.25 MG PO TBDP: 0.2500 mg | ORAL_TABLET | Freq: Every day | ORAL | 1 refills | Status: DC | PRN

## 2024-02-23 MED ORDER — PRAZOSIN HCL 1 MG PO CAPS: 1.0000 mg | ORAL_CAPSULE | Freq: Every day | ORAL | 1 refills | Status: DC

## 2024-02-23 NOTE — Patient Instructions (Signed)
 Continue venlafaxine  225 mg daily Decrease Buspar  5 mg twice a day  Start prazosin 1 mg at night  Continue clonazepam  0.25 mg daily as needed  Continue trazodone  100 mg at night as needed for insomnia Next appointment- 7/24 at 10 AM

## 2024-02-25 ENCOUNTER — Other Ambulatory Visit: Payer: Self-pay | Admitting: Psychiatry
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
## 2024-04-05 ENCOUNTER — Telehealth: Admitting: Psychiatry

## 2024-04-05 ENCOUNTER — Encounter: Payer: Self-pay | Admitting: Psychiatry

## 2024-04-05 DIAGNOSIS — F429 Obsessive-compulsive disorder, unspecified: Secondary | ICD-10-CM | POA: Diagnosis not present

## 2024-04-05 DIAGNOSIS — F419 Anxiety disorder, unspecified: Secondary | ICD-10-CM

## 2024-04-05 DIAGNOSIS — F330 Major depressive disorder, recurrent, mild: Secondary | ICD-10-CM

## 2024-04-05 DIAGNOSIS — F1320 Sedative, hypnotic or anxiolytic dependence, uncomplicated: Secondary | ICD-10-CM

## 2024-04-05 DIAGNOSIS — G4700 Insomnia, unspecified: Secondary | ICD-10-CM

## 2024-04-05 DIAGNOSIS — Z79899 Other long term (current) drug therapy: Secondary | ICD-10-CM

## 2024-04-05 DIAGNOSIS — F4001 Agoraphobia with panic disorder: Secondary | ICD-10-CM

## 2024-04-05 MED ORDER — BUSPIRONE HCL 7.5 MG PO TABS: 7.5000 mg | ORAL_TABLET | Freq: Three times a day (TID) | ORAL | 1 refills | Status: DC

## 2024-04-05 MED ORDER — PRAZOSIN HCL 1 MG PO CAPS: 1.0000 mg | ORAL_CAPSULE | Freq: Every day | ORAL | 0 refills | Status: DC

## 2024-04-05 MED ORDER — TRAZODONE HCL 100 MG PO TABS
100.0000 mg | ORAL_TABLET | Freq: Every evening | ORAL | 3 refills | Status: DC | PRN
Start: 2024-04-27 — End: 2024-08-08

## 2024-04-13 ENCOUNTER — Encounter: Payer: Self-pay | Admitting: Ophthalmology

## 2024-04-13 NOTE — Anesthesia Preprocedure Evaluation (Addendum)
 Anesthesia Evaluation  Patient identified by MRN, date of birth, ID band Patient awake    Reviewed: Allergy & Precautions, H&P , NPO status , Patient's Chart, lab work & pertinent test results  Airway Mallampati: IV  TM Distance: <3 FB Neck ROM: Full    Dental no notable dental hx. (+) Poor Dentition, Chipped, Missing Missing left upper central incisor and chipped right upper central and lateral incisors:   Pulmonary neg pulmonary ROS, former smoker   Pulmonary exam normal breath sounds clear to auscultation       Cardiovascular negative cardio ROS Normal cardiovascular exam Rhythm:Regular Rate:Normal     Neuro/Psych  PSYCHIATRIC DISORDERS Anxiety Depression Bipolar Disorder   negative neurological ROS  negative psych ROS   GI/Hepatic negative GI ROS, Neg liver ROS,GERD  ,,  Endo/Other  negative endocrine ROS    Renal/GU negative Renal ROS  negative genitourinary   Musculoskeletal negative musculoskeletal ROS (+)    Abdominal   Peds negative pediatric ROS (+)  Hematology negative hematology ROS (+)   Anesthesia Other Findings   Bipolar disorder (HCC) Depression GERD (gastroesophageal reflux disease) Anxiety OCD (obsessive compulsive disorder) Panic disorder with agoraphobia High risk medication use Benzodiazepine dependence (HCC)  This nice lady takes clonazepam  daily, so will very likely need greater than usual dose of Versed , as both are benzodiazepines. Will administer preop versed  IV. She has depression, anxiety and panic attacks, so we would like to help her feel more confortable. Also on trazodone , buspar , venlafaxine .   Reproductive/Obstetrics negative OB ROS                              Anesthesia Physical Anesthesia Plan  ASA: 3  Anesthesia Plan: MAC   Post-op Pain Management:    Induction: Intravenous  PONV Risk Score and Plan:   Airway Management Planned: Natural  Airway and Nasal Cannula  Additional Equipment:   Intra-op Plan:   Post-operative Plan:   Informed Consent: I have reviewed the patients History and Physical, chart, labs and discussed the procedure including the risks, benefits and alternatives for the proposed anesthesia with the patient or authorized representative who has indicated his/her understanding and acceptance.     Dental Advisory Given  Plan Discussed with: Anesthesiologist, CRNA and Surgeon  Anesthesia Plan Comments: (Patient consented for risks of anesthesia including but not limited to:  - adverse reactions to medications - damage to eyes, teeth, lips or other oral mucosa - nerve damage due to positioning  - sore throat or hoarseness - Damage to heart, brain, nerves, lungs, other parts of body or loss of life  Patient voiced understanding and assent.)         Anesthesia Quick Evaluation

## 2024-04-18 ENCOUNTER — Other Ambulatory Visit: Payer: Self-pay | Admitting: Psychiatry

## 2024-04-18 NOTE — Discharge Instructions (Signed)

## 2024-04-23 ENCOUNTER — Other Ambulatory Visit: Payer: Self-pay | Admitting: Psychiatry

## 2024-04-23 ENCOUNTER — Other Ambulatory Visit: Payer: Self-pay

## 2024-04-23 ENCOUNTER — Encounter: Payer: Self-pay | Admitting: Ophthalmology

## 2024-04-23 ENCOUNTER — Encounter
Admission: RE | Disposition: A | Discharge status: Home / Self Care | Payer: Self-pay | Source: Home / Self Care | Attending: Ophthalmology

## 2024-04-23 ENCOUNTER — Ambulatory Visit
Admission: RE | Admit: 2024-04-23 | Discharge: 2024-04-23 | Disposition: A | Discharge status: Home / Self Care | Attending: Ophthalmology | Admitting: Ophthalmology

## 2024-04-23 ENCOUNTER — Ambulatory Visit: Payer: Self-pay | Admitting: Anesthesiology

## 2024-04-23 DIAGNOSIS — H2511 Age-related nuclear cataract, right eye: Secondary | ICD-10-CM | POA: Insufficient documentation

## 2024-04-23 DIAGNOSIS — F4001 Agoraphobia with panic disorder: Secondary | ICD-10-CM

## 2024-04-23 DIAGNOSIS — F319 Bipolar disorder, unspecified: Secondary | ICD-10-CM | POA: Diagnosis not present

## 2024-04-23 DIAGNOSIS — Z87891 Personal history of nicotine dependence: Secondary | ICD-10-CM | POA: Diagnosis not present

## 2024-04-23 DIAGNOSIS — F419 Anxiety disorder, unspecified: Secondary | ICD-10-CM | POA: Diagnosis not present

## 2024-04-23 HISTORY — DX: Agoraphobia with panic disorder: F40.01

## 2024-04-23 HISTORY — DX: Obsessive-compulsive disorder, unspecified: F42.9

## 2024-04-23 HISTORY — DX: Other long term (current) drug therapy: Z79.899

## 2024-04-23 HISTORY — PX: CATARACT EXTRACTION W/PHACO: SHX586

## 2024-04-23 HISTORY — DX: Gastro-esophageal reflux disease without esophagitis: K21.9

## 2024-04-23 HISTORY — DX: Anxiety disorder, unspecified: F41.9

## 2024-04-23 HISTORY — DX: Sedative, hypnotic or anxiolytic dependence, uncomplicated: F13.20

## 2024-04-23 SURGERY — PHACOEMULSIFICATION, CATARACT, WITH IOL INSERTION
Anesthesia: Monitor Anesthesia Care | Site: Eye | Laterality: Right

## 2024-04-23 MED ORDER — MOXIFLOXACIN HCL 0.5 % OP SOLN
OPHTHALMIC | Status: DC | PRN
  Administered 2024-04-23 (×2): .2 mL via OPHTHALMIC

## 2024-04-23 MED ORDER — SIGHTPATH DOSE#1 NA HYALUR & NA CHOND-NA HYALUR IO KIT
PACK | INTRAOCULAR | Status: DC | PRN
Start: 2024-04-23 — End: 2024-04-23
  Administered 2024-04-23 (×2): 1 via OPHTHALMIC

## 2024-04-23 MED ORDER — MIDAZOLAM HCL 2 MG/2ML IJ SOLN
INTRAMUSCULAR | Status: DC | PRN
  Administered 2024-04-23 (×4): 1 mg via INTRAVENOUS

## 2024-04-23 MED ORDER — FENTANYL CITRATE (PF) 100 MCG/2ML IJ SOLN
INTRAMUSCULAR | Status: DC | PRN
  Administered 2024-04-23 (×4): 50 ug via INTRAVENOUS

## 2024-04-23 MED ORDER — SIGHTPATH DOSE#1 BSS IO SOLN
INTRAOCULAR | Status: DC | PRN
  Administered 2024-04-23 (×2): 72 mL via OPHTHALMIC

## 2024-04-23 MED ORDER — MIDAZOLAM HCL 2 MG/2ML IJ SOLN
INTRAMUSCULAR | Status: AC
Start: 2024-04-23 — End: 2024-04-23
  Filled 2024-04-23: qty 2

## 2024-04-23 MED ORDER — SIGHTPATH DOSE#1 BSS IO SOLN
INTRAOCULAR | Status: DC | PRN
  Administered 2024-04-23 (×2): 15 mL via INTRAOCULAR

## 2024-04-23 MED ORDER — TETRACAINE HCL 0.5 % OP SOLN
1.0000 [drp] | OPHTHALMIC | Status: DC | PRN
  Administered 2024-04-23 (×6): 1 [drp] via OPHTHALMIC

## 2024-04-23 MED ORDER — ARMC OPHTHALMIC DILATING DROPS
1.0000 | OPHTHALMIC | Status: DC | PRN
  Administered 2024-04-23 (×6): 1 via OPHTHALMIC

## 2024-04-23 MED ORDER — TETRACAINE HCL 0.5 % OP SOLN
OPHTHALMIC | Status: AC
  Filled 2024-04-23: qty 4

## 2024-04-23 MED ORDER — ARMC OPHTHALMIC DILATING DROPS
OPHTHALMIC | Status: AC
  Filled 2024-04-23: qty 0.5

## 2024-04-23 MED ORDER — FENTANYL CITRATE (PF) 100 MCG/2ML IJ SOLN
INTRAMUSCULAR | Status: AC
  Filled 2024-04-23: qty 2

## 2024-04-23 MED ORDER — LIDOCAINE HCL (PF) 2 % IJ SOLN
INTRAMUSCULAR | Status: DC | PRN
  Administered 2024-04-23 (×2): 4 mL via INTRAOCULAR

## 2024-04-23 MED ORDER — LACTATED RINGERS IV SOLN: INTRAVENOUS | Status: DC

## 2024-04-23 SURGICAL SUPPLY — 9 items
DISSECTOR HYDRO NUCLEUS 50X22 (MISCELLANEOUS) ×1 IMPLANT
FEE CATARACT SUITE SIGHTPATH (MISCELLANEOUS) ×1 IMPLANT
GLOVE PI ULTRA LF STRL 7.5 (GLOVE) ×1 IMPLANT
GLOVE SURG SYN 6.5 PF PI BL (GLOVE) ×1 IMPLANT
GLOVE SURG SYN 8.5 PF PI BL (GLOVE) ×1 IMPLANT
LENS IOL TECNIS EYHANCE 21.5 (Intraocular Lens) IMPLANT
NDL FILTER BLUNT 18X1 1/2 (NEEDLE) ×1 IMPLANT
NEEDLE FILTER BLUNT 18X1 1/2 (NEEDLE) ×1 IMPLANT
SYR 3ML LL SCALE MARK (SYRINGE) ×1 IMPLANT

## 2024-04-23 NOTE — Transfer of Care (Signed)
 Immediate Anesthesia Transfer of Care Note  Patient: Shirley [name]  Procedure(s) Performed: PHACOEMULSIFICATION, CATARACT, WITH IOL INSERTION 2.16 00:23.1 (Right: Eye)  Patient Location: PACU  Anesthesia Type: MAC  Level of Consciousness: awake, alert  and patient cooperative  Airway and Oxygen Therapy: Patient Spontanous Breathing and Patient connected to supplemental oxygen  Post-op Assessment: Post-op Vital signs reviewed, Patient's Cardiovascular Status Stable, Respiratory Function Stable, Patent Airway and No signs of Nausea or vomiting  Post-op Vital Signs: Reviewed and stable  Complications: No notable events documented.

## 2024-04-23 NOTE — Op Note (Signed)
 OPERATIVE NOTE  Shirley [name] [id number] 04/23/2024   PREOPERATIVE DIAGNOSIS:  Nuclear sclerotic cataract right eye.  H25.11   POSTOPERATIVE DIAGNOSIS:    Nuclear sclerotic cataract right eye.     PROCEDURE:  Phacoemusification with posterior chamber intraocular lens placement of the right eye   LENS:   Implant Name Type Inv. Item Serial No. Manufacturer Lot No. LRB No. Used Action  LENS IOL TECNIS EYHANCE 21.5 - [id number] Intraocular Lens LENS IOL TECNIS EYHANCE 21.5 [id number] [employer]  Right 1 Implanted       Procedure(s): PHACOEMULSIFICATION, CATARACT, WITH IOL INSERTION 2.16 00:23.1 (Right)  SURGEON:  Adine Novak, MD, MPH  ANESTHESIOLOGIST: Anesthesiologist: Ola Donny BROCKS, MD CRNA: Jahoo, Sonia, CRNA   ANESTHESIA:  Topical with tetracaine  drops augmented with 1% preservative-free intracameral lidocaine .  ESTIMATED BLOOD LOSS: less than 1 mL.   COMPLICATIONS:  None.   DESCRIPTION OF PROCEDURE:  The patient was identified in the holding room and transported to the operating room and placed in the supine position under the operating microscope.  The right eye was identified as the operative eye and it was prepped and draped in the usual sterile ophthalmic fashion.   A 1.0 millimeter clear-corneal paracentesis was made at the 10:30 position. 0.5 ml of preservative-free 1% lidocaine  with epinephrine  was injected into the anterior chamber.  The anterior chamber was filled with viscoelastic.  A 2.4 millimeter keratome was used to make a near-clear corneal incision at the 8:00 position.  A curvilinear capsulorrhexis was made with a cystotome and capsulorrhexis forceps.  Balanced salt solution was used to hydrodissect and hydrodelineate the nucleus.   Phacoemulsification was then used in stop and chop fashion to remove the lens nucleus and epinucleus.  The remaining cortex was then removed using the irrigation and aspiration handpiece. Viscoelastic was then placed into the  capsular bag to distend it for lens placement.  A lens was then injected into the capsular bag.  The remaining viscoelastic was aspirated.   Wounds were hydrated with balanced salt solution.  The anterior chamber was inflated to a physiologic pressure with balanced salt solution.   Intracameral vigamox  0.1 mL undiluted was injected into the eye and a drop placed onto the ocular surface.  No wound leaks were noted.  The patient was taken to the recovery room in stable condition without complications of anesthesia or surgery  Adine Novak 04/23/2024, 11:24 AM

## 2024-04-23 NOTE — H&P (Signed)
 [facility]   Primary Care Physician:  Patient, No Pcp Per Ophthalmologist: Dr. Adine Novak  Pre-Procedure History & Physical: HPI:  Shirley [name] is a 63 y.o. female here for cataract surgery.   Past Medical History:  Diagnosis Date   Anxiety    Benzodiazepine dependence (HCC)    Bipolar disorder (HCC)    Depression    pt has cut herself in the past   GERD (gastroesophageal reflux disease)    no meds taken   High risk medication use    OCD (obsessive compulsive disorder)    Panic disorder with agoraphobia     History reviewed. No pertinent surgical history.  Prior to Admission medications   Medication Sig Start Date End Date Taking? Authorizing Provider  busPIRone  (BUSPAR ) 7.5 MG tablet Take 1 tablet (7.5 mg total) by mouth 3 (three) times daily. 04/05/24 06/04/24 Yes Vickey Mettle, MD  clonazePAM  (KLONOPIN ) 0.25 MG disintegrating tablet Take 1 tablet (0.25 mg total) by mouth daily as needed (anxiety). 02/29/24 04/29/24 Yes Hisada, Mettle, MD  prazosin  (MINIPRESS ) 1 MG capsule Take 1 capsule (1 mg total) by mouth at bedtime. 04/18/24 05/18/24 Yes Vickey Mettle, MD  traZODone  (DESYREL ) 100 MG tablet Take 1 tablet (100 mg total) by mouth at bedtime as needed for sleep. 04/27/24 08/25/24 Yes Vickey Mettle, MD  venlafaxine  XR (EFFEXOR -XR) 75 MG 24 hr capsule Take 3 capsules (225 mg total) by mouth daily. 02/25/24 06/24/24 Yes Vickey Mettle, MD    Allergies as of 04/13/2024 - Review Complete 04/13/2024  Allergen Reaction Noted   Lithium  Other (See Comments) 07/15/2015    Family History  Problem Relation Age of Onset   Cancer Mother    Atrial fibrillation Mother    Alcohol abuse Father    Cancer Father    Anxiety disorder Father    Depression Father    Hypertension Brother    Alcohol abuse Brother    Heart disease Brother    Suicidality Paternal Uncle    Alcohol abuse Paternal Grandfather    Suicidality Paternal Grandfather    Post-traumatic stress disorder Paternal  Grandfather     Social History   Socioeconomic History   Marital status: Married    Spouse name: dennis   Number of children: 2   Years of education: Not on file   Highest education level: High school graduate  Occupational History   Not on file  Tobacco Use   Smoking status: Former    Current packs/day: 0.00    Types: Cigarettes    Start date: 07/15/1983    Quit date: 07/14/2013    Years since quitting: 10.7   Smokeless tobacco: Never  Vaping Use   Vaping status: Never Used  Substance and Sexual Activity   Alcohol use: No    Alcohol/week: 0.0 standard drinks of alcohol   Drug use: Never   Sexual activity: Not Currently  Other Topics Concern   Not on file  Social History Narrative   Not on file   Social Drivers of Health   Financial Resource Strain: Not on file  Food Insecurity: Not on file  Transportation Needs: Not on file  Physical Activity: Not on file  Stress: Not on file  Social Connections: Not on file  Intimate Partner Violence: Not on file    Review of Systems: See HPI, otherwise negative ROS  Physical Exam: BP (!) 154/86   Pulse 79   Temp 98.1 F (36.7 C) (Temporal)   Resp 20   Ht 5'  2.6 (1.59 m)   Wt 106.1 kg   SpO2 97%   BMI 41.98 kg/m  General:   Alert, cooperative. Head:  Normocephalic and atraumatic. Respiratory:  Normal work of breathing. Cardiovascular:  NAD  Impression/Plan: Shirley [name] is here for cataract surgery.  Risks, benefits, limitations, and alternatives regarding cataract surgery have been reviewed with the patient.  Questions have been answered.  All parties agreeable.   Adine Novak, MD  04/23/2024, 11:01 AM

## 2024-04-23 NOTE — Anesthesia Postprocedure Evaluation (Signed)
 Anesthesia Post Note  Patient: [name]  Procedure(s) Performed: PHACOEMULSIFICATION, CATARACT, WITH IOL INSERTION 2.16 00:23.1 (Right: Eye)  Patient location during evaluation: PACU Anesthesia Type: MAC Level of consciousness: awake and alert Pain management: pain level controlled Vital Signs Assessment: post-procedure vital signs reviewed and stable Respiratory status: spontaneous breathing, nonlabored ventilation, respiratory function stable and patient connected to nasal cannula oxygen Cardiovascular status: stable and blood pressure returned to baseline Postop Assessment: no apparent nausea or vomiting Anesthetic complications: no   No notable events documented.   Last Vitals:  Vitals:   04/23/24 1127 04/23/24 1130  BP:  135/73  Pulse: 74 71  Resp: 14 13  Temp: (!) 36.4 C   SpO2: 93% 92%    Last Pain:  Vitals:   04/23/24 1130  TempSrc:   PainSc: 0-No pain                 Rashema Seawright C Charvi Gammage

## 2024-04-24 ENCOUNTER — Encounter: Payer: Self-pay | Admitting: Ophthalmology

## 2024-04-24 NOTE — Anesthesia Preprocedure Evaluation (Addendum)
 Anesthesia Evaluation  Patient identified by MRN, date of birth, ID band Patient awake    Reviewed: Allergy & Precautions, H&P , NPO status , Patient's Chart, lab work & pertinent test results  Airway Mallampati: IV  TM Distance: <3 FB Neck ROM: Full    Dental no notable dental hx. (+) Chipped, Missing Poor Dentition, Chipped, Missing Missing left upper central incisor and chipped right upper central and lateral incisors:  :   Pulmonary neg pulmonary ROS, former smoker   Pulmonary exam normal breath sounds clear to auscultation       Cardiovascular negative cardio ROS Normal cardiovascular exam Rhythm:Regular Rate:Normal     Neuro/Psych  PSYCHIATRIC DISORDERS Anxiety Depression Bipolar Disorder   negative neurological ROS  negative psych ROS   GI/Hepatic negative GI ROS, Neg liver ROS,GERD  ,,  Endo/Other  negative endocrine ROS    Renal/GU      Musculoskeletal   Abdominal   Peds  Hematology negative hematology ROS (+)   Anesthesia Other Findings Previous cataract surgery 04-23-24 Dr. Ola Did well previous surgery with versed  2 mg IV and fentanyl  100 mcg IV  Note: benzodiazepine dependence, anxiety/ panic attacks  Bipolar disorder (HCC)  Depression GERD (gastroesophageal reflux disease)  Anxiety OCD (obsessive compulsive disorder)  Panic disorder with agoraphobia High risk medication use  Benzodiazepine dependence (HCC)    Reproductive/Obstetrics negative OB ROS                              Anesthesia Physical Anesthesia Plan  ASA: 3  Anesthesia Plan: MAC   Post-op Pain Management:    Induction: Intravenous  PONV Risk Score and Plan:   Airway Management Planned: Natural Airway and Nasal Cannula  Additional Equipment:   Intra-op Plan:   Post-operative Plan:   Informed Consent: I have reviewed the patients History and Physical, chart, labs and discussed the  procedure including the risks, benefits and alternatives for the proposed anesthesia with the patient or authorized representative who has indicated his/her understanding and acceptance.     Dental Advisory Given  Plan Discussed with: Anesthesiologist, CRNA and Surgeon  Anesthesia Plan Comments: (Patient consented for risks of anesthesia including but not limited to:  - adverse reactions to medications - damage to eyes, teeth, lips or other oral mucosa - nerve damage due to positioning  - sore throat or hoarseness - Damage to heart, brain, nerves, lungs, other parts of body or loss of life  Patient voiced understanding and assent.)         Anesthesia Quick Evaluation

## 2024-05-01 ENCOUNTER — Other Ambulatory Visit: Payer: Self-pay | Admitting: Psychiatry

## 2024-05-01 NOTE — Telephone Encounter (Signed)
 Medication ordered for one month. Please advise the patient to obtain urine screening as previously discussed. No further refills will be provided without completion of the testing.

## 2024-05-01 NOTE — Telephone Encounter (Signed)
 pt left a message that she needs refill on the klonopin . pt also states that she needs rx by lunch time that she has a ride then. pt was last seen on 7-24 next appt 8-29

## 2024-05-03 NOTE — Discharge Instructions (Signed)
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
## 2024-05-07 ENCOUNTER — Encounter: Payer: Self-pay | Admitting: Ophthalmology

## 2024-05-07 ENCOUNTER — Other Ambulatory Visit: Payer: Self-pay

## 2024-05-07 ENCOUNTER — Other Ambulatory Visit: Payer: Self-pay | Admitting: Psychiatry

## 2024-05-07 ENCOUNTER — Telehealth: Payer: Self-pay

## 2024-05-07 ENCOUNTER — Ambulatory Visit
Admission: RE | Admit: 2024-05-07 | Discharge: 2024-05-07 | Disposition: A | Discharge status: Home / Self Care | Attending: Ophthalmology | Admitting: Ophthalmology

## 2024-05-07 ENCOUNTER — Encounter
Admission: RE | Disposition: A | Discharge status: Home / Self Care | Payer: Self-pay | Source: Home / Self Care | Attending: Ophthalmology

## 2024-05-07 ENCOUNTER — Ambulatory Visit: Payer: Self-pay | Admitting: Anesthesiology

## 2024-05-07 DIAGNOSIS — H2512 Age-related nuclear cataract, left eye: Secondary | ICD-10-CM | POA: Insufficient documentation

## 2024-05-07 DIAGNOSIS — F319 Bipolar disorder, unspecified: Secondary | ICD-10-CM | POA: Insufficient documentation

## 2024-05-07 DIAGNOSIS — F419 Anxiety disorder, unspecified: Secondary | ICD-10-CM | POA: Diagnosis not present

## 2024-05-07 DIAGNOSIS — K219 Gastro-esophageal reflux disease without esophagitis: Secondary | ICD-10-CM | POA: Insufficient documentation

## 2024-05-07 DIAGNOSIS — H25012 Cortical age-related cataract, left eye: Secondary | ICD-10-CM | POA: Diagnosis present

## 2024-05-07 DIAGNOSIS — Z87891 Personal history of nicotine dependence: Secondary | ICD-10-CM | POA: Diagnosis not present

## 2024-05-07 HISTORY — PX: CATARACT EXTRACTION W/PHACO: SHX586

## 2024-05-07 SURGERY — PHACOEMULSIFICATION, CATARACT, WITH IOL INSERTION
Anesthesia: Monitor Anesthesia Care | Laterality: Left

## 2024-05-07 MED ORDER — ARMC OPHTHALMIC DILATING DROPS
1.0000 | OPHTHALMIC | Status: DC | PRN
  Administered 2024-05-07 (×3): 1 via OPHTHALMIC

## 2024-05-07 MED ORDER — ARMC OPHTHALMIC DILATING DROPS
OPHTHALMIC | Status: AC
Start: 2024-05-07 — End: 2024-05-07
  Filled 2024-05-07: qty 0.5

## 2024-05-07 MED ORDER — TETRACAINE HCL 0.5 % OP SOLN
OPHTHALMIC | Status: AC
  Filled 2024-05-07: qty 4

## 2024-05-07 MED ORDER — LACTATED RINGERS IV SOLN: INTRAVENOUS | Status: DC

## 2024-05-07 MED ORDER — MIDAZOLAM HCL 2 MG/2ML IJ SOLN: 2.0000 mg | Freq: Once | INTRAMUSCULAR | Status: DC

## 2024-05-07 MED ORDER — FENTANYL CITRATE (PF) 100 MCG/2ML IJ SOLN
INTRAMUSCULAR | Status: AC
  Filled 2024-05-07: qty 2

## 2024-05-07 MED ORDER — TETRACAINE HCL 0.5 % OP SOLN
1.0000 [drp] | OPHTHALMIC | Status: DC | PRN
  Administered 2024-05-07 (×3): 1 [drp] via OPHTHALMIC

## 2024-05-07 MED ORDER — MIDAZOLAM HCL 2 MG/2ML IJ SOLN
INTRAMUSCULAR | Status: AC
  Filled 2024-05-07: qty 2

## 2024-05-07 MED ORDER — SIGHTPATH DOSE#1 BSS IO SOLN
INTRAOCULAR | Status: DC | PRN
  Administered 2024-05-07: 15 mL via INTRAOCULAR

## 2024-05-07 MED ORDER — SIGHTPATH DOSE#1 NA HYALUR & NA CHOND-NA HYALUR IO KIT
PACK | INTRAOCULAR | Status: DC | PRN
  Administered 2024-05-07: 1 via OPHTHALMIC

## 2024-05-07 MED ORDER — SIGHTPATH DOSE#1 BSS IO SOLN
INTRAOCULAR | Status: DC | PRN
  Administered 2024-05-07: 79 mL via OPHTHALMIC

## 2024-05-07 MED ORDER — MIDAZOLAM HCL 2 MG/2ML IJ SOLN
INTRAMUSCULAR | Status: DC | PRN
  Administered 2024-05-07: 2 mg via INTRAVENOUS

## 2024-05-07 MED ORDER — FENTANYL CITRATE (PF) 100 MCG/2ML IJ SOLN
INTRAMUSCULAR | Status: DC | PRN
  Administered 2024-05-07: 50 ug via INTRAVENOUS

## 2024-05-07 MED ORDER — LIDOCAINE HCL (PF) 2 % IJ SOLN
INTRAOCULAR | Status: DC | PRN
  Administered 2024-05-07: 1 mL via INTRAOCULAR

## 2024-05-07 MED ORDER — MOXIFLOXACIN HCL 0.5 % OP SOLN
OPHTHALMIC | Status: DC | PRN
  Administered 2024-05-07: .2 mL via OPHTHALMIC

## 2024-05-07 SURGICAL SUPPLY — 9 items
DISSECTOR HYDRO NUCLEUS 50X22 (MISCELLANEOUS) ×1 IMPLANT
FEE CATARACT SUITE SIGHTPATH (MISCELLANEOUS) ×1 IMPLANT
GLOVE PI ULTRA LF STRL 7.5 (GLOVE) ×1 IMPLANT
GLOVE SURG SYN 6.5 PF PI BL (GLOVE) ×1 IMPLANT
GLOVE SURG SYN 8.5 PF PI BL (GLOVE) ×1 IMPLANT
LENS IOL TECNIS EYHANCE 22.0 (Intraocular Lens) IMPLANT
NDL FILTER BLUNT 18X1 1/2 (NEEDLE) ×1 IMPLANT
NEEDLE FILTER BLUNT 18X1 1/2 (NEEDLE) ×1 IMPLANT
SYR 3ML LL SCALE MARK (SYRINGE) ×1 IMPLANT

## 2024-05-07 NOTE — Op Note (Signed)
 OPERATIVE NOTE  Shirley [name] [id number] 05/07/2024   PREOPERATIVE DIAGNOSIS:  Nuclear sclerotic cataract left eye.  H25.12   POSTOPERATIVE DIAGNOSIS:    Nuclear sclerotic cataract left eye.     PROCEDURE:  Phacoemusification with posterior chamber intraocular lens placement of the left eye   LENS:   Implant Name Type Inv. Item Serial No. Manufacturer Lot No. LRB No. Used Action  LENS IOL TECNIS EYHANCE 22.0 - [id number] Intraocular Lens LENS IOL TECNIS EYHANCE 22.0 [id number] [employer]  Left 1 Implanted      Procedure(s): PHACOEMULSIFICATION, CATARACT, WITH IOL INSERTION 2.94, 00:26.4 (Left)  SURGEON:  Adine Novak, MD, MPH   ANESTHESIA:  Topical with tetracaine  drops augmented with 1% preservative-free intracameral lidocaine .  ESTIMATED BLOOD LOSS: <1 mL   COMPLICATIONS:  None.   DESCRIPTION OF PROCEDURE:  The patient was identified in the holding room and transported to the operating room and placed in the supine position under the operating microscope.  The left eye was identified as the operative eye and it was prepped and draped in the usual sterile ophthalmic fashion.   A 1.0 millimeter clear-corneal paracentesis was made at the 5:00 position. 0.5 ml of preservative-free 1% lidocaine  with epinephrine  was injected into the anterior chamber.  The anterior chamber was filled with viscoelastic.  A 2.4 millimeter keratome was used to make a near-clear corneal incision at the 2:00 position.  A curvilinear capsulorrhexis was made with a cystotome and capsulorrhexis forceps.  Balanced salt solution was used to hydrodissect and hydrodelineate the nucleus.   Phacoemulsification was then used in stop and chop fashion to remove the lens nucleus and epinucleus.  The remaining cortex was then removed using the irrigation and aspiration handpiece. Viscoelastic was then placed into the capsular bag to distend it for lens placement.  A lens was then injected into the capsular bag.  The  remaining viscoelastic was aspirated.   Wounds were hydrated with balanced salt solution.  The anterior chamber was inflated to a physiologic pressure with balanced salt solution.  Intracameral vigamox  0.1 mL undiltued was injected into the eye and a drop placed onto the ocular surface.  No wound leaks were noted.  The patient was taken to the recovery room in stable condition without complications of anesthesia or surgery  Adine Novak 05/07/2024, 10:40 AM

## 2024-05-07 NOTE — H&P (Signed)
 [facility]   Primary Care Physician:  Patient, No Pcp Per Ophthalmologist: Dr. Adine Novak  Pre-Procedure History & Physical: HPI:  Shirley [name] is a 63 y.o. female here for cataract surgery.   Past Medical History:  Diagnosis Date   Anxiety    Benzodiazepine dependence (HCC)    Bipolar disorder (HCC)    Depression    pt has cut herself in the past   GERD (gastroesophageal reflux disease)    no meds taken   High risk medication use    OCD (obsessive compulsive disorder)    Panic disorder with agoraphobia     Past Surgical History:  Procedure Laterality Date   CATARACT EXTRACTION W/PHACO Right 04/23/2024   Procedure: PHACOEMULSIFICATION, CATARACT, WITH IOL INSERTION 2.16 00:23.1;  Surgeon: Novak Adine Anes, MD;  Location: [facility];  Service: Ophthalmology;  Laterality: Right;    Prior to Admission medications   Medication Sig Start Date End Date Taking? Authorizing Provider  busPIRone  (BUSPAR ) 7.5 MG tablet Take 1 tablet (7.5 mg total) by mouth 3 (three) times daily. 04/05/24 06/04/24 Yes Vickey Mettle, MD  clonazePAM  (KLONOPIN ) 0.25 MG disintegrating tablet Take 1 tablet (0.25 mg total) by mouth daily as needed for seizure. 05/01/24 05/31/24 Yes Hisada, Mettle, MD  prazosin  (MINIPRESS ) 1 MG capsule Take 1 capsule (1 mg total) by mouth at bedtime. 04/18/24 05/18/24 Yes Vickey Mettle, MD  traZODone  (DESYREL ) 100 MG tablet Take 1 tablet (100 mg total) by mouth at bedtime as needed for sleep. 04/27/24 08/25/24 Yes Vickey Mettle, MD  venlafaxine  XR (EFFEXOR -XR) 75 MG 24 hr capsule Take 3 capsules (225 mg total) by mouth daily. 02/25/24 06/24/24 Yes Vickey Mettle, MD    Allergies as of 04/13/2024 - Review Complete 04/13/2024  Allergen Reaction Noted   Lithium  Other (See Comments) 07/15/2015    Family History  Problem Relation Age of Onset   Cancer Mother    Atrial fibrillation Mother    Alcohol abuse Father    Cancer Father    Anxiety disorder Father     Depression Father    Hypertension Brother    Alcohol abuse Brother    Heart disease Brother    Suicidality Paternal Uncle    Alcohol abuse Paternal Grandfather    Suicidality Paternal Grandfather    Post-traumatic stress disorder Paternal Grandfather     Social History   Socioeconomic History   Marital status: Married    Spouse name: dennis   Number of children: 2   Years of education: Not on file   Highest education level: High school graduate  Occupational History   Not on file  Tobacco Use   Smoking status: Former    Current packs/day: 0.00    Types: Cigarettes    Start date: 07/15/1983    Quit date: 07/14/2013    Years since quitting: 10.8   Smokeless tobacco: Never  Vaping Use   Vaping status: Never Used  Substance and Sexual Activity   Alcohol use: No    Alcohol/week: 0.0 standard drinks of alcohol   Drug use: Never   Sexual activity: Not Currently  Other Topics Concern   Not on file  Social History Narrative   Not on file   Social Drivers of Health   Financial Resource Strain: Not on file  Food Insecurity: Not on file  Transportation Needs: Not on file  Physical Activity: Not on file  Stress: Not on file  Social Connections: Not on file  Intimate Partner Violence: Not on  file    Review of Systems: See HPI, otherwise negative ROS  Physical Exam: BP 133/73   Temp 97.8 F (36.6 C) (Temporal)   Resp 14   Ht 5' 2.6 (1.59 m)   Wt 106 kg   SpO2 96%   BMI 41.91 kg/m  General:   Alert, cooperative. Head:  Normocephalic and atraumatic. Respiratory:  Normal work of breathing. Cardiovascular:  NAD  Impression/Plan: Shirley [name] is here for cataract surgery.  Risks, benefits, limitations, and alternatives regarding cataract surgery have been reviewed with the patient.  Questions have been answered.  All parties agreeable.   Adine Novak, MD  05/07/2024, 10:14 AM

## 2024-05-07 NOTE — Transfer of Care (Signed)
 Immediate Anesthesia Transfer of Care Note  Patient: Shirley [name]  Procedure(s) Performed: PHACOEMULSIFICATION, CATARACT, WITH IOL INSERTION 2.94, 00:26.4 (Left)  Patient Location: PACU  Anesthesia Type: MAC  Level of Consciousness: awake, alert  and patient cooperative  Airway and Oxygen Therapy: Patient Spontanous Breathing and Patient connected to supplemental oxygen  Post-op Assessment: Post-op Vital signs reviewed, Patient's Cardiovascular Status Stable, Respiratory Function Stable, Patent Airway and No signs of Nausea or vomiting  Post-op Vital Signs: Reviewed and stable  Complications: No notable events documented.

## 2024-05-07 NOTE — Telephone Encounter (Signed)
 There is no regular tablet formulation available for clonazepam  0.25 mg. Please advise her to check with a different pharmacy to see if they carry the orally disintegrating version.   Also, please remind her to complete the urine screening as soon as possible so that I can proceed with submitting a new order for her medication.

## 2024-05-07 NOTE — Telephone Encounter (Signed)
 pt left message that the pharmacy did not have the disntegrating tablets they stated that they needed new rx for the regular tablets for the clonazepam . pt was last seen on 7-24 next appt 8-29

## 2024-05-07 NOTE — Anesthesia Postprocedure Evaluation (Signed)
 Anesthesia Post Note  Patient: Shirley [name]  Procedure(s) Performed: PHACOEMULSIFICATION, CATARACT, WITH IOL INSERTION 2.94, 00:26.4 (Left)  Patient location during evaluation: PACU Anesthesia Type: MAC Level of consciousness: awake and alert Pain management: pain level controlled Vital Signs Assessment: post-procedure vital signs reviewed and stable Respiratory status: spontaneous breathing, nonlabored ventilation, respiratory function stable and patient connected to nasal cannula oxygen Cardiovascular status: blood pressure returned to baseline and stable Postop Assessment: no apparent nausea or vomiting Anesthetic complications: no   No notable events documented.   Last Vitals:  Vitals:   05/07/24 0912 05/07/24 1043  BP: 133/73 120/70  Resp: 14   Temp: 36.6 C 36.8 C  SpO2: 96% 96%    Last Pain:  Vitals:   05/07/24 1043  TempSrc:   PainSc: 0-No pain                 Debby Mines

## 2024-05-08 NOTE — Telephone Encounter (Signed)
 pt was notified - she states she call office back when she finds someone that has the medication in stock.

## 2024-05-09 ENCOUNTER — Other Ambulatory Visit: Payer: Self-pay | Admitting: Psychiatry

## 2024-05-09 MED ORDER — CLONAZEPAM 0.25 MG PO TBDP
0.2500 mg | ORAL_TABLET | Freq: Every day | ORAL | 0 refills | Status: DC | PRN
Start: 2024-05-09 — End: 2024-06-18

## 2024-05-09 NOTE — Telephone Encounter (Signed)
 Pt.notified

## 2024-05-09 NOTE — Telephone Encounter (Signed)
 Ordered

## 2024-05-09 NOTE — Telephone Encounter (Signed)
 pt left a message that she needs [facility] [address].  She states she has not had in 2 days.

## 2024-05-11 ENCOUNTER — Encounter: Payer: Self-pay | Admitting: Psychiatry

## 2024-05-11 ENCOUNTER — Telehealth: Admitting: Psychiatry

## 2024-05-11 DIAGNOSIS — F3341 Major depressive disorder, recurrent, in partial remission: Secondary | ICD-10-CM

## 2024-05-11 DIAGNOSIS — G4700 Insomnia, unspecified: Secondary | ICD-10-CM | POA: Diagnosis not present

## 2024-05-11 DIAGNOSIS — F429 Obsessive-compulsive disorder, unspecified: Secondary | ICD-10-CM

## 2024-05-11 DIAGNOSIS — F419 Anxiety disorder, unspecified: Secondary | ICD-10-CM | POA: Diagnosis not present

## 2024-05-11 MED ORDER — VENLAFAXINE HCL ER 75 MG PO CP24: 225.0000 mg | ORAL_CAPSULE | Freq: Every day | ORAL | 5 refills | Status: DC

## 2024-05-11 MED ORDER — BUSPIRONE HCL 7.5 MG PO TABS: 7.5000 mg | ORAL_TABLET | Freq: Three times a day (TID) | ORAL | 1 refills | Status: DC

## 2024-05-11 MED ORDER — PRAZOSIN HCL 1 MG PO CAPS: 1.0000 mg | ORAL_CAPSULE | Freq: Every day | ORAL | 5 refills | Status: DC

## 2024-05-11 NOTE — Patient Instructions (Signed)
 Continue venlafaxine  225 mg daily Continue buspar  7.5 mg three times a day Continue prazosin  1 mg at night  Obtain UDS Continue clonazepam  0.25 mg daily as needed  Continue trazodone  100 mg at night as needed for insomnia Next appointment- 10/24 at 9 am

## 2024-06-18 ENCOUNTER — Telehealth: Payer: Self-pay | Admitting: Psychiatry

## 2024-06-18 ENCOUNTER — Other Ambulatory Visit (HOSPITAL_COMMUNITY): Payer: Self-pay | Admitting: Psychiatry

## 2024-06-18 MED ORDER — CLONAZEPAM 0.25 MG PO TBDP: 0.2500 mg | ORAL_TABLET | Freq: Every day | ORAL | 0 refills | Status: DC | PRN

## 2024-06-18 MED ORDER — VENLAFAXINE HCL ER 75 MG PO CP24: 225.0000 mg | ORAL_CAPSULE | Freq: Every day | ORAL | 0 refills | Status: DC

## 2024-06-18 MED ORDER — PRAZOSIN HCL 1 MG PO CAPS: 1.0000 mg | ORAL_CAPSULE | Freq: Every day | ORAL | 0 refills | Status: DC

## 2024-06-18 MED ORDER — BUSPIRONE HCL 7.5 MG PO TABS: 7.5000 mg | ORAL_TABLET | Freq: Three times a day (TID) | ORAL | 0 refills | Status: DC

## 2024-06-18 NOTE — Telephone Encounter (Signed)
 sent

## 2024-07-06 ENCOUNTER — Telehealth: Admitting: Psychiatry

## 2024-07-15 ENCOUNTER — Other Ambulatory Visit: Payer: Self-pay | Admitting: Psychiatry

## 2024-07-15 NOTE — Telephone Encounter (Signed)
 Please advise her to obtain urine screening as we discussed at her last visit. I will not be able to send in any more refills.

## 2024-07-16 NOTE — Telephone Encounter (Signed)
 Called patient made her aware of the refill had been sent in and that she would need to have the urine drug screen done as discussed at her last visit to continue to get refills she voiced understanding she also mentioned that she is now in [place]  her daughter is having a baby and she should be back in a few weeks and will have the UDS done when she returns

## 2024-07-19 ENCOUNTER — Other Ambulatory Visit (HOSPITAL_COMMUNITY): Payer: Self-pay | Admitting: Psychiatry

## 2024-08-08 ENCOUNTER — Telehealth: Payer: Self-pay | Admitting: Psychiatry

## 2024-08-08 ENCOUNTER — Other Ambulatory Visit: Payer: Self-pay | Admitting: Psychiatry

## 2024-08-08 DIAGNOSIS — F4001 Agoraphobia with panic disorder: Secondary | ICD-10-CM

## 2024-08-08 MED ORDER — PRAZOSIN HCL 1 MG PO CAPS: 1.0000 mg | ORAL_CAPSULE | Freq: Every day | ORAL | 1 refills | Status: DC

## 2024-08-08 MED ORDER — BUSPIRONE HCL 7.5 MG PO TABS: 7.5000 mg | ORAL_TABLET | Freq: Three times a day (TID) | ORAL | 1 refills | Status: DC

## 2024-08-08 MED ORDER — VENLAFAXINE HCL ER 75 MG PO CP24: 225.0000 mg | ORAL_CAPSULE | Freq: Every day | ORAL | 5 refills | Status: AC

## 2024-08-08 MED ORDER — TRAZODONE HCL 100 MG PO TABS: 100.0000 mg | ORAL_TABLET | Freq: Every evening | ORAL | 3 refills | Status: AC | PRN

## 2024-08-08 NOTE — Telephone Encounter (Signed)
 Patient is in [place]  for birth of grandbaby and had to cancel her 08-15-24 appointment. She is rescheduled for 09-12-24. She will need a refill on the prazosin  if can send to the local pharmacy and she can get it transferred. Please call and advise if others need refilling too. She was unsure

## 2024-08-08 NOTE — Telephone Encounter (Signed)
 I have ordered Trazodone , prazosin , buspar , venlafaxine . Clonazepam  has not been prescribed at this time, as a urine screening has not been completed. I do not think it can be transferred outside of the state either.

## 2024-08-08 NOTE — Telephone Encounter (Signed)
Called patient to inform of message she voiced understanding

## 2024-08-15 ENCOUNTER — Telehealth: Admitting: Psychiatry
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
## 2024-09-10 ENCOUNTER — Other Ambulatory Visit: Payer: Self-pay | Admitting: Psychiatry

## 2024-09-12 ENCOUNTER — Encounter: Payer: Self-pay | Admitting: Psychiatry

## 2024-09-12 ENCOUNTER — Telehealth: Admitting: Psychiatry

## 2024-09-12 DIAGNOSIS — F419 Anxiety disorder, unspecified: Secondary | ICD-10-CM | POA: Diagnosis not present

## 2024-09-12 DIAGNOSIS — G4700 Insomnia, unspecified: Secondary | ICD-10-CM | POA: Diagnosis not present

## 2024-09-12 DIAGNOSIS — F3341 Major depressive disorder, recurrent, in partial remission: Secondary | ICD-10-CM | POA: Diagnosis not present

## 2024-09-12 DIAGNOSIS — F429 Obsessive-compulsive disorder, unspecified: Secondary | ICD-10-CM | POA: Diagnosis not present

## 2024-09-12 MED ORDER — BUSPIRONE HCL 7.5 MG PO TABS: 7.5000 mg | ORAL_TABLET | Freq: Three times a day (TID) | ORAL | 1 refills | Status: AC

## 2024-10-24 ENCOUNTER — Telehealth: Admitting: Psychiatry
